# Patient Record
Sex: Male | Born: 1980 | Race: Asian | Hispanic: No | Marital: Married | Smoking: Former smoker
Health system: Southern US, Community
[De-identification: ages and names within clinical notes are randomized; demographics above are authoritative.]

## PROBLEM LIST (undated history)

## (undated) DIAGNOSIS — I10 Essential (primary) hypertension: Secondary | ICD-10-CM

## (undated) DIAGNOSIS — E785 Hyperlipidemia, unspecified: Secondary | ICD-10-CM

## (undated) HISTORY — PX: NO PAST SURGERIES: SHX2092

---

## 2012-04-20 ENCOUNTER — Other Ambulatory Visit: Payer: Self-pay | Admitting: Family Medicine

## 2012-04-20 ENCOUNTER — Ambulatory Visit
Admission: RE | Admit: 2012-04-20 | Discharge: 2012-04-20 | Disposition: A | Discharge status: Home / Self Care | Payer: Managed Care, Other (non HMO) | Source: Ambulatory Visit | Attending: Family Medicine | Admitting: Family Medicine

## 2012-04-20 DIAGNOSIS — J069 Acute upper respiratory infection, unspecified: Secondary | ICD-10-CM

## 2012-06-16 ENCOUNTER — Encounter (HOSPITAL_COMMUNITY): Payer: Self-pay | Admitting: Emergency Medicine

## 2012-06-16 ENCOUNTER — Emergency Department (INDEPENDENT_AMBULATORY_CARE_PROVIDER_SITE_OTHER): Payer: Managed Care, Other (non HMO)

## 2012-06-16 ENCOUNTER — Emergency Department (HOSPITAL_COMMUNITY)
Admission: EM | Admit: 2012-06-16 | Discharge: 2012-06-16 | Disposition: A | Discharge status: Home / Self Care | Payer: Managed Care, Other (non HMO) | Source: Home / Self Care | Attending: Family Medicine | Admitting: Family Medicine

## 2012-06-16 DIAGNOSIS — J309 Allergic rhinitis, unspecified: Secondary | ICD-10-CM

## 2012-06-16 DIAGNOSIS — M542 Cervicalgia: Secondary | ICD-10-CM

## 2012-06-16 DIAGNOSIS — J302 Other seasonal allergic rhinitis: Secondary | ICD-10-CM

## 2012-06-16 DIAGNOSIS — R209 Unspecified disturbances of skin sensation: Secondary | ICD-10-CM

## 2012-06-16 DIAGNOSIS — R202 Paresthesia of skin: Secondary | ICD-10-CM

## 2012-06-16 HISTORY — DX: Essential (primary) hypertension: I10

## 2012-06-16 HISTORY — DX: Hyperlipidemia, unspecified: E78.5

## 2012-06-16 LAB — POCT I-STAT, CHEM 8
Calcium, Ion: 1.22 mmol/L (ref 1.12–1.23)
Glucose, Bld: 88 mg/dL (ref 70–99)
HCT: 48 % (ref 39.0–52.0)
TCO2: 28 mmol/L (ref 0–100)

## 2012-06-16 MED ORDER — IBUPROFEN 600 MG PO TABS: 600.0000 mg | ORAL_TABLET | Freq: Three times a day (TID) | ORAL | Status: DC | PRN

## 2012-06-16 MED ORDER — CETIRIZINE HCL 10 MG PO TABS: 10.0000 mg | ORAL_TABLET | Freq: Every evening | ORAL | Status: DC | PRN

## 2012-06-16 NOTE — ED Notes (Signed)
Pt c/o numbness in left arm and hand x 1 week. Also has pain in chest right around rib cage. Feels SOB when walking for 10+ minutes. No changes in diet or strenuous activity recently. Took Acetaminophen for pain once time and symptoms subsided then returned. Patient is alert and oriented.

## 2012-06-19 NOTE — ED Provider Notes (Signed)
History     CSN: [id number]  Arrival date & time 06/16/12  1555   First MD Initiated Contact with Patient 06/16/12 1714      Chief Complaint  Patient presents with  . Numbness    (Consider location/radiation/quality/duration/timing/severity/associated sxs/prior treatment) HPI Comments: 32 y/o male smoker with prior h/o dyslipidemia and HTN. No taking any medications currently. here with the following complaints: 1) Neck pain for several month with radiation to left shoulder and left arm down to his left wrist. Associated with paresthesias (pulling and tingling sensation) intermittently. 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 Denies weakness or numbness. Patient is right handed. Tylenol helped.  2) Left lower rib border chest discomfort. Also intermittent. Denies chest pain here.  3) patient states she feels short of breath after walking for 10 minutes. State he was exercises and daily routine has remained the same. He tries to walk daily and has been feeling shortness of breath after 10 minutes of walking for over one week. Denies leg swelling or PND.  4) Wants to have cholesterol checked. 5) Nasal congestion, sneezing and bilateral ear pressure intermittently for 1-2 weeks. No cough, no wheezing.  States he has felt stressed as [place] from [place] recently.    Past Medical History  Diagnosis Date  . Hypertension   . Hyperlipemia     History reviewed. No pertinent past surgical history.  No family history on file.  History  Substance Use Topics  . Smoking status: Current Every Day Smoker -- 0.25 packs/day    Types: Cigarettes  . Smokeless tobacco: Not on file  . Alcohol Use: Yes     Comment: occasioinally      Review of Systems  Constitutional: Negative for fever, chills, diaphoresis, activity change, appetite change and unexpected weight change.  HENT: Positive for congestion and sneezing. Negative for sore throat and trouble  swallowing.   Respiratory: Positive for shortness of breath. Negative for cough, chest tightness and wheezing.   Cardiovascular: Negative for palpitations and leg swelling.  Gastrointestinal: Negative for nausea, vomiting, abdominal pain and diarrhea.  Endocrine: Negative for cold intolerance, heat intolerance, polydipsia, polyphagia and polyuria.  Musculoskeletal:       As per HPI  Neurological: Negative for dizziness, tremors, seizures, syncope, facial asymmetry, speech difficulty, weakness, light-headedness, numbness and headaches.  Psychiatric/Behavioral: Negative for behavioral problems and dysphoric mood.    Allergies  Review of patient's allergies indicates no known allergies.  Home Medications   Current Outpatient Rx  Name  Route  Sig  Dispense  Refill  . cetirizine (ZYRTEC) 10 MG tablet   Oral   Take 1 tablet (10 mg total) by mouth at bedtime as needed for allergies.   30 tablet   0   . ibuprofen (ADVIL,MOTRIN) 600 MG tablet   Oral   Take 1 tablet (600 mg total) by mouth every 8 (eight) hours as needed for pain.   21 tablet   0     BP 126/92  Pulse 86  Temp(Src) 99.3 F (37.4 C) (Oral)  Resp 16  SpO2 99%  Physical Exam  Nursing note and vitals reviewed. Constitutional: He is oriented to person, place, and time. He appears well-developed and well-nourished. No distress.  HENT:  Head: Normocephalic and atraumatic.  Right Ear: External ear normal.  Left Ear: External ear normal.  Nasal Congestion with erythema and swelling of nasal turbinates, clear rhinorrhea. no pharyngeal erythema no exudates. No uvula deviation. No trismus.  TM's normal.  Eyes: Conjunctivae and EOM are normal. Pupils are equal, round, and reactive to light. No scleral icterus.  Neck: Neck supple. No JVD present. No thyromegaly present.  Cervical spine central: No obvious deformity. No pain over bone processes.  Fair range of motion despite reported pain. Able to touch chest with chin and  extend with minimal discomfort. Pain worse with bilateral rotation. Increased tone and tenderness to palpation over cervical paraspinal muscles and trapezium muscles bilaterally. Negative Spurling test.   Cardiovascular: Normal rate, regular rhythm and normal heart sounds.  Exam reveals no gallop and no friction rub.   No murmur heard. Pulmonary/Chest: Breath sounds normal. No respiratory distress. He has no wheezes. He has no rales. He exhibits no tenderness.  Lymphadenopathy:    He has no cervical adenopathy.  Neurological: He is alert and oriented to person, place, and time. He has normal strength and normal reflexes. No cranial nerve deficit or sensory deficit. He displays a negative Romberg sign. Coordination and gait normal.  Normal bilateral upper extremity strength. No arm droop.  Symmetric DTR's. Appears intact superficial sensation and proprioception.   Skin: He is not diaphoretic.    ED Course  Procedures (including critical care time)  Labs Reviewed  POCT I-STAT, CHEM 8   No results found. EKG:  NSR, VR 72 bpm, no ischemic changes.   1. Paresthesia of left arm   2. Neck pain   3. Seasonal allergies       MDM  Reassuring physical exam,  normal EKG and poc I-stat: (electrolytes, blood sugar and creatinine).  No signs of stenosis or malalignment on plain neck Xrays.  Treated with cetirizine and ibuprofen.  reccommended to stablish care with a PCP and provided contact info for PCP offices in our area. Cardiology referral as needed. Will likely require a stress test.   Supportive care and red flags that should prompt hi return to medical attention discussed with patient and provided in wriitng.            Sharin Grave, MD 06/23/12 1004

## 2015-11-16 ENCOUNTER — Ambulatory Visit (INDEPENDENT_AMBULATORY_CARE_PROVIDER_SITE_OTHER): Payer: Managed Care, Other (non HMO) | Admitting: Family Medicine

## 2015-11-16 ENCOUNTER — Encounter: Payer: Self-pay | Admitting: Family Medicine

## 2015-11-16 VITALS — BP 118/74 | HR 62 | Temp 98.1°F | Resp 12 | Ht 67.0 in | Wt 200.1 lb

## 2015-11-16 DIAGNOSIS — E6609 Other obesity due to excess calories: Secondary | ICD-10-CM

## 2015-11-16 DIAGNOSIS — M79605 Pain in left leg: Secondary | ICD-10-CM | POA: Diagnosis not present

## 2015-11-16 DIAGNOSIS — Z114 Encounter for screening for human immunodeficiency virus [HIV]: Secondary | ICD-10-CM

## 2015-11-16 DIAGNOSIS — Z6831 Body mass index (BMI) 31.0-31.9, adult: Secondary | ICD-10-CM | POA: Diagnosis not present

## 2015-11-16 DIAGNOSIS — M79652 Pain in left thigh: Secondary | ICD-10-CM | POA: Diagnosis not present

## 2015-11-16 LAB — COMPREHENSIVE METABOLIC PANEL
ALBUMIN: 4.3 g/dL (ref 3.5–5.2)
ALK PHOS: 67 U/L (ref 39–117)
ALT: 29 U/L (ref 0–53)
AST: 20 U/L (ref 0–37)
BILIRUBIN TOTAL: 0.5 mg/dL (ref 0.2–1.2)
BUN: 10 mg/dL (ref 6–23)
CALCIUM: 9.5 mg/dL (ref 8.4–10.5)
CO2: 29 meq/L (ref 19–32)
CREATININE: 0.79 mg/dL (ref 0.40–1.50)
Chloride: 105 mEq/L (ref 96–112)
GFR: 117.17 mL/min (ref >60.00)
Glucose, Bld: 80 mg/dL (ref 70–99)
Potassium: 4.3 mEq/L (ref 3.5–5.1)
Sodium: 138 mEq/L (ref 135–145)
TOTAL PROTEIN: 7 g/dL (ref 6.0–8.3)

## 2015-11-16 LAB — HIV ANTIBODY (ROUTINE TESTING W REFLEX): HIV 1&2 Ab, 4th Generation: NONREACTIVE

## 2015-11-16 LAB — LIPID PANEL
CHOL/HDL RATIO: 5
CHOLESTEROL: 185 mg/dL (ref 0–200)
HDL: 38.5 mg/dL — ABNORMAL LOW (ref >39.00)
LDL Cholesterol: 111 mg/dL — ABNORMAL HIGH (ref 0–99)
NonHDL: 146.24
TRIGLYCERIDES: 175 mg/dL — AB (ref 0.0–149.0)
VLDL: 35 mg/dL (ref 0.0–40.0)

## 2015-11-16 MED ORDER — IBUPROFEN 600 MG PO TABS: 600.0000 mg | ORAL_TABLET | Freq: Three times a day (TID) | ORAL | 0 refills | Status: DC | PRN

## 2015-11-16 NOTE — Progress Notes (Signed)
Musculoskeletal Exam  Patient: Kevin Callahan DOB: May 30, 1980  DOS: 11/16/2015  SUBJECTIVE:  Chief Complaint:   Chief Complaint  Patient presents with  . Establish Care    requesting cpe    Kevin Callahan is a 35 y.o.  male for evaluation and treatment of L thigh pain.   Onset:  1 yr ago. Was playing badminton, sudden.  Location: Left anterior thigh and left calf Character:  dull  Progression of issue:  is unchanged Associated symptoms: None Treatment: to date has been: None.   Neurovascular symptoms: no  ROS: Musculoskeletal/Extremities: +L calf and thigh pain Neurologic: no numbness, tingling no weakness   Past Medical History:  Diagnosis Date  . Hyperlipemia   . Hypertension    Past Surgical History:  Procedure Laterality Date  . NO PAST SURGERIES     History reviewed. No pertinent family history. Current Outpatient Prescriptions  Medication Sig Dispense Refill  . cetirizine (ZYRTEC) 10 MG tablet Take 1 tablet (10 mg total) by mouth at bedtime as needed for allergies. (Patient not taking: Reported on 11/16/2015) 30 tablet 0  . ibuprofen (ADVIL,MOTRIN) 600 MG tablet Take 1 tablet (600 mg total) by mouth every 8 (eight) hours as needed for pain. (Patient not taking: Reported on 11/16/2015) 21 tablet 0   No Known Allergies Social History   Social History  . Marital status: Married   Social History Main Topics  . Smoking status: Former Smoker    Packs/day: 0.25    Types: Cigarettes  . Smokeless tobacco: Never Used  . Alcohol use Yes     Comment: occasioinally  . Drug use: No   Objective: VITAL SIGNS: BP 118/74 (BP Location: Left Arm, Patient Position: Sitting, Cuff Size: Normal)   Pulse 62   Temp 98.1 F (36.7 C) (Oral)   Resp 12   Ht 5\' 7"  (1.702 m)   Wt 200 lb 1 oz (90.7 kg)   SpO2 96%   BMI 31.33 kg/m  Constitutional: Well formed, well developed. No acute distress. Cardiovascular: RRR, no LE edema, no bruits Thorax & Lungs: No accessory muscle  use,CTAB Extremities: No clubbing. No cyanosis. No edema.  Skin: Warm. Dry. No erythema. No rash.  Musculoskeletal: L thigh.   Normal active range of motion: yes.   Normal passive range of motion: yes Tenderness to palpation: yes- diffusely along the quadriceps on the left, also tender to palpation over the lateral left calf Deformity: no Ecchymosis: no 5/5 strength throughout the lower extremities, pain was elicited with resisted knee extension on the left Neurologic: Normal sensory function. No focal deficits noted. DTR's equal and symmetry in LE's. No clonus. Psychiatric: Normal mood. Age appropriate judgment and insight. Alert & oriented x 3.    Assessment:  Left leg pain - Plan: ibuprofen (ADVIL,MOTRIN) 600 MG tablet  Pain of left thigh - Plan: ibuprofen (ADVIL,MOTRIN) 600 MG tablet  Class 1 obesity due to excess calories with body mass index (BMI) of 31.0 to 31.9 in adult, unspecified whether serious comorbidity present - Plan: Comprehensive metabolic panel, Lipid panel  Screening for HIV (human immunodeficiency virus) - Plan: HIV antibody  Plan: Orders as above. I personally showed him several stretches that he said did feet make his pain feel better. Recommended heat, could add Tylenol, and twice daily stretching. He also wanted to bring up issues with his scalp and low back pain. He will follow-up in 2 weeks for this. I would like to see him in 4 weeks if his leg pain is  not better and we'll consider physical therapy. He did not want to start out with physical therapy today. The patient voiced understanding and agreement to the plan.   Jilda Roche [place], DO 11/16/15  10:50 AM

## 2015-11-16 NOTE — Progress Notes (Signed)
Pre visit review using our clinic review tool, if applicable. No additional management support is needed unless otherwise documented below in the visit note. 

## 2015-11-16 NOTE — Patient Instructions (Addendum)
Do your stretches 2 times daily. Hold each stretch for 20-30 seconds. Repeat 2-3 times. Use heat for around 5-10 minutes before stretching. Use the pain medicine as needed. You may also you Tylenol for this.  Get rid of any scented products that go on the skin. This may help with your itching.

## 2015-11-30 ENCOUNTER — Ambulatory Visit: Payer: Managed Care, Other (non HMO) | Admitting: Family Medicine

## 2015-12-10 ENCOUNTER — Encounter: Payer: Self-pay | Admitting: Family Medicine

## 2015-12-10 ENCOUNTER — Ambulatory Visit (INDEPENDENT_AMBULATORY_CARE_PROVIDER_SITE_OTHER): Payer: Managed Care, Other (non HMO) | Admitting: Family Medicine

## 2015-12-10 VITALS — BP 92/68 | HR 112 | Temp 98.1°F | Ht 67.0 in | Wt 198.0 lb

## 2015-12-10 DIAGNOSIS — J029 Acute pharyngitis, unspecified: Secondary | ICD-10-CM | POA: Diagnosis not present

## 2015-12-10 DIAGNOSIS — R059 Cough, unspecified: Secondary | ICD-10-CM

## 2015-12-10 DIAGNOSIS — R05 Cough: Secondary | ICD-10-CM | POA: Diagnosis not present

## 2015-12-10 MED ORDER — BENZONATATE 100 MG PO CAPS: 100.0000 mg | ORAL_CAPSULE | Freq: Three times a day (TID) | ORAL | 0 refills | Status: DC | PRN

## 2015-12-10 NOTE — Patient Instructions (Signed)
For your foot, try over the counter orthotics. Powerstep is available at [employer] or other retailers.

## 2015-12-10 NOTE — Progress Notes (Signed)
Chief Complaint  Patient presents with  . Cough    Pt reports fever and cough x5 days with sore throat     Kevin ReamerGanesh Callahan here for URI complaints.  Duration: 5 days  Associated symptoms: subjective fever, productive cough, sinus congestion and sore throat Denies: rhinorrhea, itchy watery eyes, ear pain, ear drainage and shortness of breath Treatment to date: Nyquil Sick contacts: Yes- Daughter has strep throat  ROS:  Const: +fevers HEENT: As noted in HPI Lungs: No SOB  Past Medical History:  Diagnosis Date  . Hyperlipemia   . Hypertension    No family history on file.  BP 92/68 (BP Location: Left Arm, Patient Position: Sitting, Cuff Size: Small)   Pulse (!) 112   Temp 98.1 F (36.7 C) (Oral)   Ht 5\' 7"  (1.702 m)   Wt 198 lb (89.8 kg)   SpO2 98%   BMI 31.01 kg/m  General: Awake, alert, appears stated age HEENT: AT, [place], ears patent b/l and TM's neg, nares patent w/o discharge, pharynx pink and without exudates, MMM Neck: No masses or asymmetry Heart: RRR, no murmurs, no bruits Lungs: CTAB, no accessory muscle use Psych: Age appropriate judgment and insight, normal mood and affect  Cough - Plan: benzonatate (TESSALON) 100 MG capsule  Sore throat  Orders as above. Rapid strep neg. Pt brought up foot pain at the end of the visit. I noticed he has flat feet so I recommended OTC Powerstep orthotics. F/u for this during a separate appt. F/u in 1 week if symptoms worsen or fail to improve. Pt voiced understanding and agreement to the plan.  Kevin Rocheicholas Paul [place], DO 12/10/15 3:51 PM

## 2015-12-10 NOTE — Progress Notes (Signed)
Pre visit review using our clinic review tool, if applicable. No additional management support is needed unless otherwise documented below in the visit note. 

## 2018-04-29 ENCOUNTER — Telehealth: Payer: Self-pay

## 2018-04-29 NOTE — Telephone Encounter (Signed)
Called the patient and has moved away. Updated PCP in chart.

## 2018-04-29 NOTE — Telephone Encounter (Signed)
Last ov 2017- virtual visit?  

## 2019-03-05 ENCOUNTER — Ambulatory Visit: Payer: 59 | Attending: Internal Medicine

## 2019-03-05 DIAGNOSIS — Z23 Encounter for immunization: Secondary | ICD-10-CM

## 2019-03-05 NOTE — Progress Notes (Signed)
   Covid-19 Vaccination Clinic  Name:  Kevin Callahan    MRN: [id number] DOB: 1980/05/28  03/05/2019  Mr. Bare was observed post Covid-19 immunization for 15 minutes without incidence. He was provided with Vaccine Information Sheet and instruction to access the V-Safe system.   [name] was instructed to call 911 with any severe reactions post vaccine: Marland Kitchen Difficulty breathing  . Swelling of your face and throat  . A fast heartbeat  . A bad rash all over your body  . Dizziness and weakness    Immunizations Administered    Name Date Dose VIS Date Route   Pfizer COVID-19 Vaccine 03/05/2019  2:27 PM 0.3 mL 12/31/2018 Intramuscular   Manufacturer: [employer], [employer]   Lot: [id number]   NDC: [address]-4

## 2019-03-28 ENCOUNTER — Ambulatory Visit: Payer: 59 | Attending: Internal Medicine

## 2019-03-28 DIAGNOSIS — Z23 Encounter for immunization: Secondary | ICD-10-CM | POA: Insufficient documentation

## 2019-03-28 NOTE — Progress Notes (Signed)
   Covid-19 Vaccination Clinic  Name:  Kevin Callahan    MRN: [id number] DOB: Jun 06, 1980  03/28/2019  [name] was observed post Covid-19 immunization for 15 minutes without incident. He was provided with Vaccine Information Sheet and instruction to access the V-Safe system.   [name] was instructed to call 911 with any severe reactions post vaccine: Marland Kitchen Difficulty breathing  . Swelling of face and throat  . A fast heartbeat  . A bad rash all over body  . Dizziness and weakness   Immunizations Administered    Name Date Dose VIS Date Route   Pfizer COVID-19 Vaccine 03/28/2019  2:45 PM 0.3 mL 12/31/2018 Intramuscular   Manufacturer: [employer], [employer]   Lot: [id number]   NDC: [address]-7

## 2021-06-08 ENCOUNTER — Observation Stay (HOSPITAL_COMMUNITY)
Admission: EM | Admit: 2021-06-08 | Discharge: 2021-06-10 | Disposition: A | Discharge status: Home / Self Care | Payer: 59 | Attending: Family Medicine | Admitting: Family Medicine

## 2021-06-08 ENCOUNTER — Emergency Department (HOSPITAL_COMMUNITY): Payer: 59

## 2021-06-08 ENCOUNTER — Encounter (HOSPITAL_COMMUNITY): Payer: Self-pay | Admitting: Emergency Medicine

## 2021-06-08 ENCOUNTER — Inpatient Hospital Stay (HOSPITAL_COMMUNITY): Payer: 59

## 2021-06-08 DIAGNOSIS — R1084 Generalized abdominal pain: Principal | ICD-10-CM | POA: Insufficient documentation

## 2021-06-08 DIAGNOSIS — R1011 Right upper quadrant pain: Secondary | ICD-10-CM | POA: Diagnosis not present

## 2021-06-08 DIAGNOSIS — E876 Hypokalemia: Secondary | ICD-10-CM | POA: Diagnosis not present

## 2021-06-08 DIAGNOSIS — E872 Acidosis, unspecified: Secondary | ICD-10-CM | POA: Diagnosis not present

## 2021-06-08 DIAGNOSIS — Z87891 Personal history of nicotine dependence: Secondary | ICD-10-CM | POA: Diagnosis not present

## 2021-06-08 DIAGNOSIS — R1010 Upper abdominal pain, unspecified: Principal | ICD-10-CM

## 2021-06-08 DIAGNOSIS — R42 Dizziness and giddiness: Secondary | ICD-10-CM | POA: Diagnosis not present

## 2021-06-08 DIAGNOSIS — I10 Essential (primary) hypertension: Secondary | ICD-10-CM | POA: Insufficient documentation

## 2021-06-08 DIAGNOSIS — R109 Unspecified abdominal pain: Secondary | ICD-10-CM | POA: Diagnosis present

## 2021-06-08 LAB — BLOOD GAS, VENOUS
Acid-base deficit: 3.8 mmol/L — ABNORMAL HIGH (ref 0.0–2.0)
Bicarbonate: 21.5 mmol/L (ref 20.0–28.0)
Drawn by: 58743
O2 Saturation: 87.4 %
Patient temperature: 36.7
pCO2, Ven: 38 mmHg — ABNORMAL LOW (ref 44–60)
pH, Ven: 7.35 (ref 7.25–7.43)
pO2, Ven: 52 mmHg — ABNORMAL HIGH (ref 32–45)

## 2021-06-08 LAB — CBC WITH DIFFERENTIAL/PLATELET
Abs Immature Granulocytes: 0.14 10*3/uL — ABNORMAL HIGH (ref 0.00–0.07)
Basophils Absolute: 0 10*3/uL (ref 0.0–0.1)
Basophils Relative: 0 %
Eosinophils Absolute: 0.1 10*3/uL (ref 0.0–0.5)
Eosinophils Relative: 1 %
HCT: 44.2 % (ref 39.0–52.0)
Hemoglobin: 15.5 g/dL (ref 13.0–17.0)
Immature Granulocytes: 2 %
Lymphocytes Relative: 31 %
Lymphs Abs: 3 10*3/uL (ref 0.7–4.0)
MCH: 29.1 pg (ref 26.0–34.0)
MCHC: 35.1 g/dL (ref 30.0–36.0)
MCV: 82.9 fL (ref 80.0–100.0)
Monocytes Absolute: 0.6 10*3/uL (ref 0.1–1.0)
Monocytes Relative: 6 %
Neutro Abs: 5.8 10*3/uL (ref 1.7–7.7)
Neutrophils Relative %: 60 %
Platelets: 360 10*3/uL (ref 150–400)
RBC: 5.33 MIL/uL (ref 4.22–5.81)
RDW: 12.6 % (ref 11.5–15.5)
WBC: 9.6 10*3/uL (ref 4.0–10.5)
nRBC: 0 % (ref 0.0–0.2)

## 2021-06-08 LAB — URINALYSIS, ROUTINE W REFLEX MICROSCOPIC
Bilirubin Urine: NEGATIVE
Glucose, UA: 50 mg/dL — AB
Hgb urine dipstick: NEGATIVE
Ketones, ur: 5 mg/dL — AB
Leukocytes,Ua: NEGATIVE
Nitrite: NEGATIVE
Protein, ur: NEGATIVE mg/dL
Specific Gravity, Urine: 1.012 (ref 1.005–1.030)
pH: 5 (ref 5.0–8.0)

## 2021-06-08 LAB — HEMOGLOBIN A1C
Hgb A1c MFr Bld: 4.9 % (ref 4.8–5.6)
Mean Plasma Glucose: 93.93 mg/dL

## 2021-06-08 LAB — RAPID URINE DRUG SCREEN, HOSP PERFORMED
Amphetamines: NOT DETECTED
Barbiturates: NOT DETECTED
Benzodiazepines: NOT DETECTED
Cocaine: NOT DETECTED
Opiates: POSITIVE — AB
Tetrahydrocannabinol: NOT DETECTED

## 2021-06-08 LAB — COMPREHENSIVE METABOLIC PANEL
ALT: 20 U/L (ref 0–44)
AST: 25 U/L (ref 15–41)
Albumin: 4.2 g/dL (ref 3.5–5.0)
Alkaline Phosphatase: 66 U/L (ref 38–126)
Anion gap: 16 — ABNORMAL HIGH (ref 5–15)
BUN: 6 mg/dL (ref 6–20)
CO2: 17 mmol/L — ABNORMAL LOW (ref 22–32)
Calcium: 9.3 mg/dL (ref 8.9–10.3)
Chloride: 106 mmol/L (ref 98–111)
Creatinine, Ser: 1.09 mg/dL (ref 0.61–1.24)
GFR, Estimated: >60 mL/min (ref >60)
Glucose, Bld: 205 mg/dL — ABNORMAL HIGH (ref 70–99)
Potassium: 3.2 mmol/L — ABNORMAL LOW (ref 3.5–5.1)
Sodium: 139 mmol/L (ref 135–145)
Total Bilirubin: 0.4 mg/dL (ref 0.3–1.2)
Total Protein: 6.9 g/dL (ref 6.5–8.1)

## 2021-06-08 LAB — SALICYLATE LEVEL: Salicylate Lvl: <7 mg/dL — ABNORMAL LOW (ref 7.0–30.0)

## 2021-06-08 LAB — HIV ANTIBODY (ROUTINE TESTING W REFLEX): HIV Screen 4th Generation wRfx: NONREACTIVE

## 2021-06-08 LAB — TROPONIN I (HIGH SENSITIVITY)
Troponin I (High Sensitivity): 4 ng/L (ref <18)
Troponin I (High Sensitivity): 6 ng/L (ref <18)

## 2021-06-08 LAB — LIPASE, BLOOD: Lipase: 30 U/L (ref 11–51)

## 2021-06-08 LAB — ETHANOL: Alcohol, Ethyl (B): <10 mg/dL (ref <10)

## 2021-06-08 LAB — LACTIC ACID, PLASMA
Lactic Acid, Venous: 6.7 mmol/L (ref 0.5–1.9)
Lactic Acid, Venous: 5.9 mmol/L (ref 0.5–1.9)
Lactic Acid, Venous: 7.2 mmol/L (ref 0.5–1.9)
Lactic Acid, Venous: 4.8 mmol/L (ref 0.5–1.9)

## 2021-06-08 MED ORDER — OXYCODONE HCL 5 MG PO TABS
2.5000 mg | ORAL_TABLET | Freq: Four times a day (QID) | ORAL | Status: DC | PRN
  Administered 2021-06-08 – 2021-06-09 (×3): 2.5 mg via ORAL
  Filled 2021-06-08 (×3): qty 1

## 2021-06-08 MED ORDER — LACTATED RINGERS IV BOLUS
1500.0000 mL | Freq: Once | INTRAVENOUS | Status: AC
  Administered 2021-06-08: 1500 mL via INTRAVENOUS

## 2021-06-08 MED ORDER — ACETAMINOPHEN 650 MG RE SUPP: 650.0000 mg | Freq: Four times a day (QID) | RECTAL | Status: DC | PRN

## 2021-06-08 MED ORDER — POLYETHYLENE GLYCOL 3350 17 G PO PACK
17.0000 g | PACK | Freq: Once | ORAL | Status: AC
  Administered 2021-06-08: 17 g via ORAL
  Filled 2021-06-08: qty 1

## 2021-06-08 MED ORDER — ONDANSETRON HCL 4 MG PO TABS: 4.0000 mg | ORAL_TABLET | Freq: Four times a day (QID) | ORAL | Status: DC | PRN

## 2021-06-08 MED ORDER — IOHEXOL 350 MG/ML SOLN
100.0000 mL | Freq: Once | INTRAVENOUS | Status: AC | PRN
  Administered 2021-06-08: 100 mL via INTRAVENOUS

## 2021-06-08 MED ORDER — POLYETHYLENE GLYCOL 3350 17 G PO PACK
17.0000 g | PACK | Freq: Every day | ORAL | Status: DC
  Administered 2021-06-08: 17 g via ORAL
  Filled 2021-06-08: qty 1

## 2021-06-08 MED ORDER — ACETAMINOPHEN 325 MG PO TABS
650.0000 mg | ORAL_TABLET | Freq: Four times a day (QID) | ORAL | Status: DC | PRN
  Filled 2021-06-08: qty 2

## 2021-06-08 MED ORDER — SODIUM CHLORIDE 0.9 % IV SOLN: INTRAVENOUS | Status: DC

## 2021-06-08 MED ORDER — ONDANSETRON HCL 4 MG/2ML IJ SOLN
4.0000 mg | Freq: Four times a day (QID) | INTRAMUSCULAR | Status: DC | PRN
  Administered 2021-06-08: 4 mg via INTRAVENOUS
  Filled 2021-06-08: qty 2

## 2021-06-08 MED ORDER — PANTOPRAZOLE SODIUM 40 MG PO TBEC
40.0000 mg | DELAYED_RELEASE_TABLET | Freq: Every day | ORAL | Status: DC
  Administered 2021-06-08 – 2021-06-09 (×2): 40 mg via ORAL
  Filled 2021-06-08 (×2): qty 1

## 2021-06-08 MED ORDER — MECLIZINE HCL 12.5 MG PO TABS
12.5000 mg | ORAL_TABLET | Freq: Two times a day (BID) | ORAL | Status: DC
  Administered 2021-06-08: 12.5 mg via ORAL
  Filled 2021-06-08 (×2): qty 1

## 2021-06-08 MED ORDER — LACTATED RINGERS IV BOLUS
1000.0000 mL | Freq: Once | INTRAVENOUS | Status: AC
  Administered 2021-06-08: 1000 mL via INTRAVENOUS

## 2021-06-08 MED ORDER — SODIUM CHLORIDE 0.9 % IV BOLUS
1000.0000 mL | Freq: Once | INTRAVENOUS | Status: AC
  Administered 2021-06-08: 1000 mL via INTRAVENOUS

## 2021-06-08 MED ORDER — CALCIUM CARBONATE ANTACID 500 MG PO CHEW
1.0000 | CHEWABLE_TABLET | Freq: Once | ORAL | Status: AC
  Administered 2021-06-08: 200 mg via ORAL
  Filled 2021-06-08: qty 1

## 2021-06-08 MED ORDER — SODIUM CHLORIDE 0.9 % IV SOLN
25.0000 mg | Freq: Once | INTRAVENOUS | Status: AC
  Administered 2021-06-08: 25 mg via INTRAVENOUS
  Filled 2021-06-08: qty 1

## 2021-06-08 MED ORDER — SENNOSIDES-DOCUSATE SODIUM 8.6-50 MG PO TABS
1.0000 | ORAL_TABLET | Freq: Every day | ORAL | Status: DC
  Administered 2021-06-08 – 2021-06-10 (×3): 1 via ORAL
  Filled 2021-06-08 (×3): qty 1

## 2021-06-08 MED ORDER — MORPHINE SULFATE (PF) 4 MG/ML IV SOLN
4.0000 mg | Freq: Once | INTRAVENOUS | Status: AC
  Administered 2021-06-08: 4 mg via INTRAVENOUS
  Filled 2021-06-08: qty 1

## 2021-06-08 MED ORDER — POTASSIUM CHLORIDE 20 MEQ PO PACK: 40.0000 meq | PACK | Freq: Two times a day (BID) | ORAL | Status: DC

## 2021-06-08 MED ORDER — HYDROMORPHONE HCL 1 MG/ML IJ SOLN
1.0000 mg | Freq: Once | INTRAMUSCULAR | Status: AC
  Administered 2021-06-08: 1 mg via INTRAVENOUS
  Filled 2021-06-08: qty 1

## 2021-06-08 MED ORDER — POTASSIUM CHLORIDE 20 MEQ PO PACK
40.0000 meq | PACK | Freq: Once | ORAL | Status: AC
  Administered 2021-06-08: 40 meq via ORAL
  Filled 2021-06-08: qty 2

## 2021-06-08 MED ORDER — ACETAMINOPHEN 500 MG PO TABS
1000.0000 mg | ORAL_TABLET | Freq: Four times a day (QID) | ORAL | Status: DC
  Administered 2021-06-08 – 2021-06-10 (×7): 1000 mg via ORAL
  Filled 2021-06-08 (×8): qty 2

## 2021-06-08 NOTE — ED Notes (Signed)
Patient transported to [facility] 

## 2021-06-08 NOTE — H&P (Addendum)
Family Medicine Teaching [facility] Admission History and Physical Service Pager: [phone]  Patient name: Kevin Callahan Medical record number: [id number] Date of birth: 16-Sep-1980 Age: 41 y.o. Gender: male  Primary Care Provider: [facility], [facility] Consultants: Gen Surg Code Status: Full code Preferred Emergency Contact:  Contact Information     Name Relation Home Work Cleona Spouse [phone]  [phone]        Chief Complaint: Abdominal Pain  Assessment and Plan: Kevin Callahan is a 42 y.o. male presenting with abdominal pain. PMH is significant for HTN and HLD.   Abdominal Pain with lactic acidosis  Patient reports mid epigastric pain that started from the upper back before radiating to his abdomen. On exam patient abdomen is soft, distended and non-tender. Somnolent during encounter likely due to dilaudid but was easily aroused .  Initial labs show normal lipase and normal LFTs. Lactic acid was elevated to 6.9 which was suspicious for ischemic bowel etiology but CTA abdomen was negative for thoracic or abdominal aortic dissection, retroperitoneal hematoma or intestinal obstruction. His elevated lactic acid is suspicious for ischemic colitis although no evidence at this time. Unclear cause of abdominal pain other differentials include biliary colic, gastritis etc. RUQ [place] is pending.  General surgery consulted in the ED due to initial concern for aortic dissection who recommend pain management and admission for observation. - Admit to med surg with FPTS, attending Dr. Manson Passey -General surg consulted in the ER, appreciate recs -Regular diet  -Continue routine vitals -Avoid NSAIDs -Pain regime Tylenol 1000 mg Q6H Oxycodone 2.5 mg every 6 hours as needed -Zofran Q6H PRN -Pantoprazole 40 mg daily -Follow-up RUQ [place] - Start mIVF NS at 191ml/hr -Trend LA -Consider GI consult am if no improvement in sx overnight  Hypokalemia K  on admission was 3.2. Patient denies any chest pains or palpitation. Will replete with K. Could be 2/2 vomiting episodes this morning -Morning BMP lab -Replete as necessary  Vertigo Patient reported having abrupt onset of vertigo this morning accompanied with nausea and vomiting x1. He still reports intermittent felling of the room spinning. No trinities, change in vision or difficulty hearing. Will start him on Meclizine -Start Meclizine 12.5mg  BID   HLD Last lipid panel was about 5 years ago. Will follow up lipid panael with AM labs.  Not on any home meds. -Lipid Panel with morning Labs   HTN BP range on admission 129-165/85-98. He is asymptomatic and not on any home medication. Could benefit from BP medication. Needs follow up with PCP outpatient -Will monitor BP with routine vitals.       FEN/GI: regular diet, normal saline  Prophylaxis: early ambulation   Disposition: Med Surg  History of Present Illness:  Kevin Callahan is a 41 y.o. male presenting with mid epigastric abdominal pain  Patient reported abdominal pain that started sometime around 9 AM this morning.  Describes it as sudden onset of squeezing pain started on his upper back and radiated to the epigastric area.  Chest pain was 10 out of 10 and pain was not elicited by feeding.  He tried ginger beer with no relief and nothing seems to aggravate or relieve the pain.  Reports he has always had normal bowel movements.  Last BM was yesterday.  Constipation, no diarrhea's, no blood in stool.  Has no recent change in diet or recent travel.  Denies having any long history of aspirin or ibuprofen use.  No history of ulcers and not  on any medication.  Patient did report having nausea with NBNB emesis earlier this morning prior to the onset of the abdominal pain. He reported the room was spinning earlier this morning before his nausea and vomiting which has since improved.   Of note patient stated that in the past 5 years he  occassionally get bloated with heavy feeding and that usually improved with over the counter antacids. He's never been diagnosed with GERD.  In the ED, patient received GI cocktail which patient reports relieved his pain. He also received IV bolus of NS.    Review Of Systems: Per HPI with the following additions:   Review of Systems  HENT:  Negative for hearing loss and tinnitus.   Respiratory:  Negative for cough, chest tightness and shortness of breath.   Cardiovascular:  Negative for chest pain, palpitations and leg swelling.  Gastrointestinal:  Positive for abdominal pain and nausea. Negative for blood in stool, constipation and diarrhea.  Musculoskeletal:  Positive for back pain. Negative for gait problem and joint swelling.  Neurological:  Positive for dizziness. Negative for speech difficulty, weakness and headaches.    Patient Active Problem List   Diagnosis Date Noted   Abdominal pain 06/08/2021    Past Medical History: Past Medical History:  Diagnosis Date   Hyperlipemia    Hypertension     Past Surgical History: Past Surgical History:  Procedure Laterality Date   NO PAST SURGERIES      Social History: Social History   Tobacco Use   Smoking status: Former    Packs/day: 0.25    Types: Cigarettes   Smokeless tobacco: Never  Substance Use Topics   Alcohol use: Yes    Comment: occasioinally   Drug use: No   Additional social history:   Please also refer to relevant sections of EMR.  Family History: No family history on file.   Allergies and Medications: No Known Allergies No current facility-administered medications on file prior to encounter.   Current Outpatient Medications on File Prior to Encounter  Medication Sig Dispense Refill   benzonatate (TESSALON) 100 MG capsule Take 1 capsule (100 mg total) by mouth 3 (three) times daily as needed for cough. 20 capsule 0   cetirizine (ZYRTEC) 10 MG tablet Take 1 tablet (10 mg total) by mouth at bedtime as  needed for allergies. 30 tablet 0   ibuprofen (ADVIL,MOTRIN) 600 MG tablet Take 1 tablet (600 mg total) by mouth every 8 (eight) hours as needed. 30 tablet 0    Objective: BP (!) 161/98   Pulse 91   Temp 98 F (36.7 C) (Oral)   Resp 18   Ht 5\' 7"  (1.702 m)   Wt 88.5 kg   SpO2 97%   BMI 30.54 kg/m  Exam: General: Alert, well appearing, somnolent HEENT: Atraumatic, MMM, No sclera icterus CV: RRR, no murmurs, normal S1/S2 Pulm: CTAB, good WOB on RA, no crackles or wheezing Abd: Soft, distended, no tenderness Skin: dry, warm Ext: No BLE edema,   Labs and Imaging: CBC BMET  Recent Labs  Lab 06/08/21 0904  WBC 9.6  HGB 15.5  HCT 44.2  PLT 360   Recent Labs  Lab 06/08/21 0904  NA 139  K 3.2*  CL 106  CO2 17*  BUN 6  CREATININE 1.09  GLUCOSE 205*  CALCIUM 9.3     DG Abdomen Acute W/Chest  Result Date: 06/08/2021 CLINICAL DATA:  Abdominal pain, nausea, vomiting EXAM: DG ABDOMEN ACUTE WITH 1 VIEW CHEST COMPARISON:  None Available. FINDINGS: Bowel gas pattern is nonspecific. Moderate amount of stool is seen in the colon. There is no fecal impaction in the rectosigmoid. No abnormal masses or calcifications are seen. Transverse diameter of heart is slightly increased. There are no signs of pulmonary edema or focal pulmonary consolidation. There is no pleural effusion or pneumothorax. IMPRESSION: Nonspecific bowel gas pattern. There are no focal pulmonary infiltrates. Electronically Signed   By: Ernie AvenaPalani  Rathinasamy M.D.   On: 06/08/2021 10:00   CT Angio Chest/Abd/Pel for Dissection W and/or Wo Contrast  Result Date: 06/08/2021 CLINICAL DATA:  Acute aortic syndrome suspected EXAM: CT ANGIOGRAPHY CHEST, ABDOMEN AND PELVIS TECHNIQUE: Non-contrast CT of the chest was initially obtained. Multidetector CT imaging through the chest, abdomen and pelvis was performed using the standard protocol during bolus administration of intravenous contrast. Multiplanar reconstructed images and  MIPs were obtained and reviewed to evaluate the vascular anatomy. RADIATION DOSE REDUCTION: This exam was performed according to the departmental dose-optimization program which includes automated exposure control, adjustment of the mA and/or kV according to patient size and/or use of iterative reconstruction technique. CONTRAST:  100mL OMNIPAQUE IOHEXOL 350 MG/ML SOLN COMPARISON:  None Available. FINDINGS: CTA CHEST FINDINGS Cardiovascular: There is no mural hematoma in thoracic aorta in the noncontrast images. There is no demonstrable intimal flap in the thoracic aorta. Major branches of thoracic aorta appear patent. 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 There are no intraluminal filling defects in the central pulmonary artery branches. Evaluation of small peripheral branches is limited in this examination tailored for evaluation of the systemic circulation. Mediastinum/Nodes: No significant lymphadenopathy seen. There is 8 mm low-density nodule in the left lobe of thyroid. Lungs/Pleura: There is small air-filled structure posterior to the trachea in the lower neck, possibly a diverticulum arising from the cervical esophagus. There is no evidence of any adjacent inflammation. There are no focal pulmonary infiltrates. There is no pleural effusion or pneumothorax. Musculoskeletal: Unremarkable. Review of the MIP images confirms the above findings. CTA ABDOMEN AND PELVIS FINDINGS VASCULAR Aorta: There is no evidence of dissection in the abdominal aorta. There is no focal aneurysmal dilation. Celiac: Normal. SMA: No significant stenosis. Renals: Unremarkable. IMA: Patent. Iliac arteries: Unremarkable. Veins: Unremarkable. Review of the MIP images confirms the above findings. NON-VASCULAR Hepatobiliary: No focal abnormality is seen. There is no dilation of bile ducts. Gallbladder is unremarkable. Pancreas: No focal abnormality is seen. Spleen: Unremarkable. Adrenals/Urinary Tract: Adrenals are  unremarkable. There is no hydronephrosis. There are no renal or ureteral stones. Urinary bladder is unremarkable. Stomach/Bowel: Small hiatal hernia is seen. Small bowel loops are not dilated. Appendix is unremarkable. There is no significant wall thickening in colon. Lymphatic: No significant lymphadenopathy seen. Reproductive: Unremarkable. Other: There is no ascites or pneumoperitoneum. Umbilical hernia containing fat is seen. Musculoskeletal: Unremarkable. Review of the MIP images confirms the above findings. IMPRESSION: There is no evidence of dissection in the thoracic and abdominal aorta. Major branches of thoracic and abdominal aorta are patent. There is no mediastinal or retroperitoneal hematoma. 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 This recommendation follows 2010 ACCF/AHA/AATS/ACR/ASA/SCA/SCAI/SIR/STS/SVM Guidelines for the Diagnosis and Management of Patients with Thoracic Aortic Disease. Circulation. 2010; 121: U272-Z366: E266-e369. Aortic aneurysm NOS (ICD10-I71.9) There is no evidence of central pulmonary artery embolism. There is no focal pulmonary consolidation. There is no evidence of intestinal obstruction or pneumoperitoneum. There is no hydronephrosis. 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 Small hiatal hernia. Other findings as described in the body of the report. Electronically Signed   By: Ernie Avena M.D.   On: 06/08/2021 10:18   US SCROTUM W/DOPPLER  Result Date: 06/08/2021 CLINICAL DATA:  Concern for testicular torsion Abdominal pain, nausea, emesis EXAM: SCROTAL ULTRASOUND DOPPLER ULTRASOUND OF THE TESTICLES TECHNIQUE: Complete ultrasound examination of the testicles, epididymis, and other scrotal structures was performed. Color and spectral Doppler ultrasound were also utilized to evaluate blood flow to the testicles. COMPARISON:  CT angiography chest, abdomen, and pelvis 06/08/2021 FINDINGS: Right  testicle Measurements: 3.9 x 2.6 x 2.9 cm. No mass or microlithiasis visualized. Left testicle Measurements: 3.7 x 2.4 x 3.0 cm. 7 mm simple cyst of doubtful clinical significance. Right epididymis:  Normal in size and appearance. Left epididymis:  5 mm left epididymal cyst. Hydrocele:  None visualized. Varicocele:  None visualized. Pulsed Doppler interrogation of both testes demonstrates normal low resistance arterial and venous waveforms bilaterally. IMPRESSION: No significant sonographic abnormality of the scrotum. Electronically Signed   By: Acquanetta Belling M.D.   On: 06/08/2021 11:10      EKG: Normal sinus rhythm with new T wave inversions in T3.   Jerre Simon, MD 06/08/2021, 1:29 PM PGY-1, [facility] Family Medicine FPTS Intern pager: [phone], text pages welcome   FPTS Upper-Level Resident Addendum   I have independently interviewed and examined the patient. I have discussed the above with the original author and agree with their documentation. My edits for correction/addition/clarification are in black. Please see also any attending notes.   Towanda Octave MD PGY-3, [facility] Family Medicine 06/08/2021 7:45 PM  FPTS Service pager: [phone] (text pages welcome through [employer])

## 2021-06-08 NOTE — Consult Note (Signed)
CC/Reason for consult: Abdominal pain  Requesting Physician: Marda Stalker, MD  HPI: Kevin Callahan is an 41 y.o. male hx of HTN, HLD presented to the emergency department via EMS earlier today with on waking having developed dizziness that was significant.  He reports the room was spinning.  This caused him to feel nauseated and he began throwing up.  He also at that time developed abdominal pain.  The pain has been described as somewhat sharp and radiates to the flank.  Nothing seems to make it better or worse.  He denies any sick contacts.  He denies any constipation or diarrhea.  He denies any blood in his stool.  He reports his stools have been brown in color.  The emesis that he has had has been nonbloody and more of a brownish liquid color.  He tried taking gingerroot this morning but that did not seem to help.  He states he is never experienced any like this before.  He is here with one of his friend/neighbor.  They state he worked hard yesterday and had a long day. Had chips and a cookie last night and they were questioning if this could be related.  Past Medical History:  Diagnosis Date   Hyperlipemia    Hypertension     Past Surgical History:  Procedure Laterality Date   NO PAST SURGERIES      No family history on file.  Social:  reports that he has quit smoking. His smoking use included cigarettes. He smoked an average of .25 packs per day. He has never used smokeless tobacco. He reports current alcohol use. He reports that he does not use drugs.  Allergies: No Known Allergies  Medications: I have reviewed the patient's current medications.  Results for orders placed or performed during the hospital encounter of 06/08/21 (from the past 48 hour(s))  Comprehensive metabolic panel     Status: Abnormal   Collection Time: 06/08/21  9:04 AM  Result Value Ref Range   Sodium 139 135 - 145 mmol/L   Potassium 3.2 (L) 3.5 - 5.1 mmol/L   Chloride 106 98 - 111 mmol/L   CO2  17 (L) 22 - 32 mmol/L   Glucose, Bld 205 (H) 70 - 99 mg/dL    Comment: Glucose reference range applies only to samples taken after fasting for at least 8 hours.   BUN 6 6 - 20 mg/dL   Creatinine, Ser 1.09 0.61 - 1.24 mg/dL   Calcium 9.3 8.9 - 10.3 mg/dL   Total Protein 6.9 6.5 - 8.1 g/dL   Albumin 4.2 3.5 - 5.0 g/dL   AST 25 15 - 41 U/L   ALT 20 0 - 44 U/L   Alkaline Phosphatase 66 38 - 126 U/L   Total Bilirubin 0.4 0.3 - 1.2 mg/dL   GFR, Estimated >60 >60 mL/min    Comment: (NOTE) Calculated using the CKD-EPI Creatinine Equation (2021)    Anion gap 16 (H) 5 - 15    Comment: Performed at [facility] [address], [place], [place] [address]  CBC with Differential     Status: Abnormal   Collection Time: 06/08/21  9:04 AM  Result Value Ref Range   WBC 9.6 4.0 - 10.5 K/uL   RBC 5.33 4.22 - 5.81 MIL/uL   Hemoglobin 15.5 13.0 - 17.0 g/dL   HCT 44.2 39.0 - 52.0 %   MCV 82.9 80.0 - 100.0 fL   MCH 29.1 26.0 - 34.0 pg  MCHC 35.1 30.0 - 36.0 g/dL   RDW 36.4 68.0 - 32.1 %   Platelets 360 150 - 400 K/uL   nRBC 0.0 0.0 - 0.2 %   Neutrophils Relative % 60 %   Neutro Abs 5.8 1.7 - 7.7 K/uL   Lymphocytes Relative 31 %   Lymphs Abs 3.0 0.7 - 4.0 K/uL   Monocytes Relative 6 %   Monocytes Absolute 0.6 0.1 - 1.0 K/uL   Eosinophils Relative 1 %   Eosinophils Absolute 0.1 0.0 - 0.5 K/uL   Basophils Relative 0 %   Basophils Absolute 0.0 0.0 - 0.1 K/uL   Immature Granulocytes 2 %   Abs Immature Granulocytes 0.14 (H) 0.00 - 0.07 K/uL    Comment: Performed at [facility], [address], [place], [place] [address]  Lipase, blood     Status: None   Collection Time: 06/08/21  9:04 AM  Result Value Ref Range   Lipase 30 11 - 51 U/L    Comment: Performed at [facility], [address], [place], [place] [address]  Troponin I (High Sensitivity)     Status: None   Collection Time: 06/08/21  9:04 AM  Result Value Ref Range   Troponin I (High Sensitivity) 4 <18 ng/L     Comment: (NOTE) Elevated high sensitivity troponin I (hsTnI) values and significant  changes across serial measurements may suggest ACS but many other  chronic and acute conditions are known to elevate hsTnI results.  Refer to the "Links" section for chest pain algorithms and additional  guidance. Performed at [facility], [address], [place], [place] [address]   Lactic acid, plasma     Status: Abnormal   Collection Time: 06/08/21  9:06 AM  Result Value Ref Range   Lactic Acid, Venous 6.7 (HH) 0.5 - 1.9 mmol/L    Comment: CRITICAL RESULT CALLED TO, READ BACK BY AND VERIFIED WITH: Vela Prose, RN AT 1051 ON 5.20.2023 BY W SMITH Performed at [facility], [address], [place], [place] [address]   Urinalysis, Routine w reflex microscopic     Status: Abnormal   Collection Time: 06/08/21  9:33 AM  Result Value Ref Range   Color, Urine YELLOW YELLOW   APPearance CLEAR CLEAR   Specific Gravity, Urine 1.012 1.005 - 1.030   pH 5.0 5.0 - 8.0   Glucose, UA 50 (A) NEGATIVE mg/dL   Hgb urine dipstick NEGATIVE NEGATIVE   Bilirubin Urine NEGATIVE NEGATIVE   Ketones, ur 5 (A) NEGATIVE mg/dL   Protein, ur NEGATIVE NEGATIVE mg/dL   Nitrite NEGATIVE NEGATIVE   Leukocytes,Ua NEGATIVE NEGATIVE    Comment: Performed at [facility], [address], [place], [place] [address]  Rapid urine drug screen (hospital performed)     Status: Abnormal   Collection Time: 06/08/21  9:33 AM  Result Value Ref Range   Opiates POSITIVE (A) NONE DETECTED   Cocaine NONE DETECTED NONE DETECTED   Benzodiazepines NONE DETECTED NONE DETECTED   Amphetamines NONE DETECTED NONE DETECTED   Tetrahydrocannabinol NONE DETECTED NONE DETECTED   Barbiturates NONE DETECTED NONE DETECTED    Comment: (NOTE) DRUG SCREEN FOR MEDICAL PURPOSES ONLY.  IF CONFIRMATION IS NEEDED FOR ANY PURPOSE, NOTIFY LAB WITHIN 5 DAYS.  LOWEST DETECTABLE LIMITS FOR URINE DRUG SCREEN Drug Class                      Cutoff (ng/mL) Amphetamine and metabolites  1000 Barbiturate and metabolites    200 Benzodiazepine                 [id number] Tricyclics and metabolites     300 Opiates and metabolites        300 Cocaine and metabolites        300 THC                            50 Performed at [facility], [place] [address], [place], [place] [address]     DG Abdomen Acute W/Chest  Result Date: 06/08/2021 CLINICAL DATA:  Abdominal pain, nausea, vomiting EXAM: DG ABDOMEN ACUTE WITH 1 VIEW CHEST COMPARISON:  None Available. FINDINGS: Bowel gas pattern is nonspecific. Moderate amount of stool is seen in the colon. There is no fecal impaction in the rectosigmoid. No abnormal masses or calcifications are seen. Transverse diameter of heart is slightly increased. There are no signs of pulmonary edema or focal pulmonary consolidation. There is no pleural effusion or pneumothorax. IMPRESSION: Nonspecific bowel gas pattern. There are no focal pulmonary infiltrates. Electronically Signed   By: Elmer Picker M.D.   On: 06/08/2021 10:00   CT Angio Chest/Abd/Pel for Dissection W and/or Wo Contrast  Result Date: 06/08/2021 CLINICAL DATA:  Acute aortic syndrome suspected EXAM: CT ANGIOGRAPHY CHEST, ABDOMEN AND PELVIS TECHNIQUE: Non-contrast CT of the chest was initially obtained. Multidetector CT imaging through the chest, abdomen and pelvis was performed using the standard protocol during bolus administration of intravenous contrast. Multiplanar reconstructed images and MIPs were obtained and reviewed to evaluate the vascular anatomy. RADIATION DOSE REDUCTION: This exam was performed according to the departmental dose-optimization program which includes automated exposure control, adjustment of the mA and/or kV according to patient size and/or use of iterative reconstruction technique. CONTRAST:  181mL OMNIPAQUE IOHEXOL 350 MG/ML SOLN COMPARISON:  None Available. FINDINGS: CTA CHEST FINDINGS Cardiovascular: There is no mural  hematoma in thoracic aorta in the noncontrast images. There is no demonstrable intimal flap in the thoracic aorta. Major branches of thoracic aorta appear patent. 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 There are no intraluminal filling defects in the central pulmonary artery branches. Evaluation of small peripheral branches is limited in this examination tailored for evaluation of the systemic circulation. Mediastinum/Nodes: No significant lymphadenopathy seen. There is 8 mm low-density nodule in the left lobe of thyroid. Lungs/Pleura: There is small air-filled structure posterior to the trachea in the lower neck, possibly a diverticulum arising from the cervical esophagus. There is no evidence of any adjacent inflammation. There are no focal pulmonary infiltrates. There is no pleural effusion or pneumothorax. Musculoskeletal: Unremarkable. Review of the MIP images confirms the above findings. CTA ABDOMEN AND PELVIS FINDINGS VASCULAR Aorta: There is no evidence of dissection in the abdominal aorta. There is no focal aneurysmal dilation. Celiac: Normal. SMA: No significant stenosis. Renals: Unremarkable. IMA: Patent. Iliac arteries: Unremarkable. Veins: Unremarkable. Review of the MIP images confirms the above findings. NON-VASCULAR Hepatobiliary: No focal abnormality is seen. There is no dilation of bile ducts. Gallbladder is unremarkable. Pancreas: No focal abnormality is seen. Spleen: Unremarkable. Adrenals/Urinary Tract: Adrenals are unremarkable. There is no hydronephrosis. There are no renal or ureteral stones. Urinary bladder is unremarkable. Stomach/Bowel: Small hiatal hernia is seen. Small bowel loops are not dilated. Appendix is unremarkable. There is no significant wall thickening in colon. Lymphatic: No significant lymphadenopathy seen. Reproductive: Unremarkable. Other: There is no ascites  or pneumoperitoneum. Umbilical hernia containing fat is seen. Musculoskeletal: Unremarkable.  Review of the MIP images confirms the above findings. IMPRESSION: There is no evidence of dissection in the thoracic and abdominal aorta. Major branches of thoracic and abdominal aorta are patent. There is no mediastinal or retroperitoneal hematoma. 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 This recommendation follows 2010 ACCF/AHA/AATS/ACR/ASA/SCA/SCAI/SIR/STS/SVM Guidelines for the Diagnosis and Management of Patients with Thoracic Aortic Disease. Circulation. 2010; 121ML:[id number]. Aortic aneurysm NOS (ICD10-I71.9) There is no evidence of central pulmonary artery embolism. There is no focal pulmonary consolidation. There is no evidence of intestinal obstruction or pneumoperitoneum. There is no hydronephrosis. 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 Small hiatal hernia. Other findings as described in the body of the report. Electronically Signed   By: Elmer Picker M.D.   On: 06/08/2021 10:18   US SCROTUM W/DOPPLER  Result Date: 06/08/2021 CLINICAL DATA:  Concern for testicular torsion Abdominal pain, nausea, emesis EXAM: SCROTAL ULTRASOUND DOPPLER ULTRASOUND OF THE TESTICLES TECHNIQUE: Complete ultrasound examination of the testicles, epididymis, and other scrotal structures was performed. Color and spectral Doppler ultrasound were also utilized to evaluate blood flow to the testicles. COMPARISON:  CT angiography chest, abdomen, and pelvis 06/08/2021 FINDINGS: Right testicle Measurements: 3.9 x 2.6 x 2.9 cm. No mass or microlithiasis visualized. Left testicle Measurements: 3.7 x 2.4 x 3.0 cm. 7 mm simple cyst of doubtful clinical significance. Right epididymis:  Normal in size and appearance. Left epididymis:  5 mm left epididymal cyst. Hydrocele:  None visualized. Varicocele:  None visualized. Pulsed Doppler interrogation of both testes demonstrates normal low resistance arterial and venous waveforms bilaterally.  IMPRESSION: No significant sonographic abnormality of the scrotum. Electronically Signed   By: Miachel Roux M.D.   On: 06/08/2021 11:10    ROS - all of the below systems have been reviewed with the patient and positives are indicated with bold text General: chills, fever or night sweats Eyes: blurry vision or double vision ENT: epistaxis or dizziness Allergy/Immunology: itchy/watery eyes or nasal congestion Hematologic/Lymphatic: bleeding problems, blood clots or swollen lymph nodes Endocrine: temperature intolerance or unexpected weight changes Breast: new or changing breast lumps or nipple discharge Resp: cough, shortness of breath, or wheezing CV: chest pain or dyspnea on exertion GI: as per HPI GU: dysuria, trouble voiding, or hematuria MSK: joint pain or joint stiffness Neuro: TIA or stroke symptoms Derm: pruritus and skin lesion changes Psych: anxiety and depression  PE Blood pressure (!) 168/102, pulse 83, temperature 98 F (36.7 C), temperature source Oral, resp. rate 18, height 5\' 7"  (1.702 m), weight 88.5 kg, SpO2 100 %. Constitutional: NAD; conversant Eyes: Moist conjunctiva; no lid lag; anicteric Lungs: Normal respiratory effort CV: RRR; no pitting edema GI: Abd soft, mildly tender throughout without rebound nor guarding; not significantly distended; no palpable hepatosplenomegaly MSK: Normal range of motion of extremities; no clubbing/cyanosis Psychiatric: Appropriate affect; alert and oriented x3 Lymphatic: No palpable cervical or axillary lymphadenopathy  Results for orders placed or performed during the hospital encounter of 06/08/21 (from the past 48 hour(s))  Comprehensive metabolic panel     Status: Abnormal   Collection Time: 06/08/21  9:04 AM  Result Value Ref Range   Sodium 139 135 - 145 mmol/L   Potassium 3.2 (L) 3.5 - 5.1 mmol/L   Chloride 106 98 - 111 mmol/L   CO2 17 (L) 22 - 32 mmol/L   Glucose, Bld 205 (H) 70 -  99 mg/dL    Comment: Glucose reference  range applies only to samples taken after fasting for at least 8 hours.   BUN 6 6 - 20 mg/dL   Creatinine, Ser 1.09 0.61 - 1.24 mg/dL   Calcium 9.3 8.9 - 10.3 mg/dL   Total Protein 6.9 6.5 - 8.1 g/dL   Albumin 4.2 3.5 - 5.0 g/dL   AST 25 15 - 41 U/L   ALT 20 0 - 44 U/L   Alkaline Phosphatase 66 38 - 126 U/L   Total Bilirubin 0.4 0.3 - 1.2 mg/dL   GFR, Estimated >60 >60 mL/min    Comment: (NOTE) Calculated using the CKD-EPI Creatinine Equation (2021)    Anion gap 16 (H) 5 - 15    Comment: Performed at [facility] [address], [place], [place] [address]  CBC with Differential     Status: Abnormal   Collection Time: 06/08/21  9:04 AM  Result Value Ref Range   WBC 9.6 4.0 - 10.5 K/uL   RBC 5.33 4.22 - 5.81 MIL/uL   Hemoglobin 15.5 13.0 - 17.0 g/dL   HCT 44.2 39.0 - 52.0 %   MCV 82.9 80.0 - 100.0 fL   MCH 29.1 26.0 - 34.0 pg   MCHC 35.1 30.0 - 36.0 g/dL   RDW 12.6 11.5 - 15.5 %   Platelets 360 150 - 400 K/uL   nRBC 0.0 0.0 - 0.2 %   Neutrophils Relative % 60 %   Neutro Abs 5.8 1.7 - 7.7 K/uL   Lymphocytes Relative 31 %   Lymphs Abs 3.0 0.7 - 4.0 K/uL   Monocytes Relative 6 %   Monocytes Absolute 0.6 0.1 - 1.0 K/uL   Eosinophils Relative 1 %   Eosinophils Absolute 0.1 0.0 - 0.5 K/uL   Basophils Relative 0 %   Basophils Absolute 0.0 0.0 - 0.1 K/uL   Immature Granulocytes 2 %   Abs Immature Granulocytes 0.14 (H) 0.00 - 0.07 K/uL    Comment: Performed at [facility], [address], [place], [place] [address]  Lipase, blood     Status: None   Collection Time: 06/08/21  9:04 AM  Result Value Ref Range   Lipase 30 11 - 51 U/L    Comment: Performed at [facility] [address], [place], [place] [address]  Troponin I (High Sensitivity)     Status: None   Collection Time: 06/08/21  9:04 AM  Result Value Ref Range   Troponin I (High Sensitivity) 4 <18 ng/L    Comment: (NOTE) Elevated high sensitivity troponin I (hsTnI) values and significant  changes  across serial measurements may suggest ACS but many other  chronic and acute conditions are known to elevate hsTnI results.  Refer to the "Links" section for chest pain algorithms and additional  guidance. Performed at [facility], [place] [address] [place], [place] [address]   Lactic acid, plasma     Status: Abnormal   Collection Time: 06/08/21  9:06 AM  Result Value Ref Range   Lactic Acid, Venous 6.7 (HH) 0.5 - 1.9 mmol/L    Comment: CRITICAL RESULT CALLED TO, READ BACK BY AND VERIFIED WITH: Jene Every, RN AT [id number] ON 5.20.2023 BY W SMITH Performed at [facility], [address] [place], [place] [address]   Urinalysis, Routine w reflex microscopic     Status: Abnormal   Collection Time: 06/08/21  9:33 AM  Result Value Ref Range  Color, Urine YELLOW YELLOW   APPearance CLEAR CLEAR   Specific Gravity, Urine 1.012 1.005 - 1.030   pH 5.0 5.0 - 8.0   Glucose, UA 50 (A) NEGATIVE mg/dL   Hgb urine dipstick NEGATIVE NEGATIVE   Bilirubin Urine NEGATIVE NEGATIVE   Ketones, ur 5 (A) NEGATIVE mg/dL   Protein, ur NEGATIVE NEGATIVE mg/dL   Nitrite NEGATIVE NEGATIVE   Leukocytes,Ua NEGATIVE NEGATIVE    Comment: Performed at [facility] [address], [place], [place] [address]  Rapid urine drug screen (hospital performed)     Status: Abnormal   Collection Time: 06/08/21  9:33 AM  Result Value Ref Range   Opiates POSITIVE (A) NONE DETECTED   Cocaine NONE DETECTED NONE DETECTED   Benzodiazepines NONE DETECTED NONE DETECTED   Amphetamines NONE DETECTED NONE DETECTED   Tetrahydrocannabinol NONE DETECTED NONE DETECTED   Barbiturates NONE DETECTED NONE DETECTED    Comment: (NOTE) DRUG SCREEN FOR MEDICAL PURPOSES ONLY.  IF CONFIRMATION IS NEEDED FOR ANY PURPOSE, NOTIFY LAB WITHIN 5 DAYS.  LOWEST DETECTABLE LIMITS FOR URINE DRUG SCREEN Drug Class                     Cutoff (ng/mL) Amphetamine and metabolites    1000 Barbiturate and metabolites     200 Benzodiazepine                 [id number] Tricyclics and metabolites     300 Opiates and metabolites        300 Cocaine and metabolites        300 THC                            50 Performed at [facility], [place] [address] [place], [place] [address]     DG Abdomen Acute W/Chest  Result Date: 06/08/2021 CLINICAL DATA:  Abdominal pain, nausea, vomiting EXAM: DG ABDOMEN ACUTE WITH 1 VIEW CHEST COMPARISON:  None Available. FINDINGS: Bowel gas pattern is nonspecific. Moderate amount of stool is seen in the colon. There is no fecal impaction in the rectosigmoid. No abnormal masses or calcifications are seen. Transverse diameter of heart is slightly increased. There are no signs of pulmonary edema or focal pulmonary consolidation. There is no pleural effusion or pneumothorax. IMPRESSION: Nonspecific bowel gas pattern. There are no focal pulmonary infiltrates. Electronically Signed   By: Elmer Picker M.D.   On: 06/08/2021 10:00   CT Angio Chest/Abd/Pel for Dissection W and/or Wo Contrast  Result Date: 06/08/2021 CLINICAL DATA:  Acute aortic syndrome suspected EXAM: CT ANGIOGRAPHY CHEST, ABDOMEN AND PELVIS TECHNIQUE: Non-contrast CT of the chest was initially obtained. Multidetector CT imaging through the chest, abdomen and pelvis was performed using the standard protocol during bolus administration of intravenous contrast. Multiplanar reconstructed images and MIPs were obtained and reviewed to evaluate the vascular anatomy. RADIATION DOSE REDUCTION: This exam was performed according to the departmental dose-optimization program which includes automated exposure control, adjustment of the mA and/or kV according to patient size and/or use of iterative reconstruction technique. CONTRAST:  141mL OMNIPAQUE IOHEXOL 350 MG/ML SOLN COMPARISON:  None Available. FINDINGS: CTA CHEST FINDINGS Cardiovascular: There is no mural hematoma in thoracic aorta in the noncontrast images. There is no demonstrable  intimal flap in the thoracic aorta. Major branches of thoracic aorta appear patent. 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 There are no intraluminal filling defects in the  central pulmonary artery branches. Evaluation of small peripheral branches is limited in this examination tailored for evaluation of the systemic circulation. Mediastinum/Nodes: No significant lymphadenopathy seen. There is 8 mm low-density nodule in the left lobe of thyroid. Lungs/Pleura: There is small air-filled structure posterior to the trachea in the lower neck, possibly a diverticulum arising from the cervical esophagus. There is no evidence of any adjacent inflammation. There are no focal pulmonary infiltrates. There is no pleural effusion or pneumothorax. Musculoskeletal: Unremarkable. Review of the MIP images confirms the above findings. CTA ABDOMEN AND PELVIS FINDINGS VASCULAR Aorta: There is no evidence of dissection in the abdominal aorta. There is no focal aneurysmal dilation. Celiac: Normal. SMA: No significant stenosis. Renals: Unremarkable. IMA: Patent. Iliac arteries: Unremarkable. Veins: Unremarkable. Review of the MIP images confirms the above findings. NON-VASCULAR Hepatobiliary: No focal abnormality is seen. There is no dilation of bile ducts. Gallbladder is unremarkable. Pancreas: No focal abnormality is seen. Spleen: Unremarkable. Adrenals/Urinary Tract: Adrenals are unremarkable. There is no hydronephrosis. There are no renal or ureteral stones. Urinary bladder is unremarkable. Stomach/Bowel: Small hiatal hernia is seen. Small bowel loops are not dilated. Appendix is unremarkable. There is no significant wall thickening in colon. Lymphatic: No significant lymphadenopathy seen. Reproductive: Unremarkable. Other: There is no ascites or pneumoperitoneum. Umbilical hernia containing fat is seen. Musculoskeletal: Unremarkable. Review of the MIP images confirms the above findings. IMPRESSION: There is no  evidence of dissection in the thoracic and abdominal aorta. Major branches of thoracic and abdominal aorta are patent. There is no mediastinal or retroperitoneal hematoma. 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 This recommendation follows 2010 ACCF/AHA/AATS/ACR/ASA/SCA/SCAI/SIR/STS/SVM Guidelines for the Diagnosis and Management of Patients with Thoracic Aortic Disease. Circulation. 2010; 121ML:[id number]. Aortic aneurysm NOS (ICD10-I71.9) There is no evidence of central pulmonary artery embolism. There is no focal pulmonary consolidation. There is no evidence of intestinal obstruction or pneumoperitoneum. There is no hydronephrosis. 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 Small hiatal hernia. Other findings as described in the body of the report. Electronically Signed   By: Elmer Picker M.D.   On: 06/08/2021 10:18   US SCROTUM W/DOPPLER  Result Date: 06/08/2021 CLINICAL DATA:  Concern for testicular torsion Abdominal pain, nausea, emesis EXAM: SCROTAL ULTRASOUND DOPPLER ULTRASOUND OF THE TESTICLES TECHNIQUE: Complete ultrasound examination of the testicles, epididymis, and other scrotal structures was performed. Color and spectral Doppler ultrasound were also utilized to evaluate blood flow to the testicles. COMPARISON:  CT angiography chest, abdomen, and pelvis 06/08/2021 FINDINGS: Right testicle Measurements: 3.9 x 2.6 x 2.9 cm. No mass or microlithiasis visualized. Left testicle Measurements: 3.7 x 2.4 x 3.0 cm. 7 mm simple cyst of doubtful clinical significance. Right epididymis:  Normal in size and appearance. Left epididymis:  5 mm left epididymal cyst. Hydrocele:  None visualized. Varicocele:  None visualized. Pulsed Doppler interrogation of both testes demonstrates normal low resistance arterial and venous waveforms bilaterally. IMPRESSION: No significant sonographic abnormality of the scrotum. Electronically  Signed   By: Miachel Roux M.D.   On: 06/08/2021 11:10    I have personally reviewed the relevant CTA Chest/abd/pelvis 06/08/21; CBC, CMP, lactate 06/08/2021  A/P: Kevin Callahan is an 41 y.o. male with HTN, HLD with acute onset dizziness --> nausea, vomiting, abdominal pain; lactic acidosis  -Agree with admission for at least observation and further resuscitation -Normal WBC, afebrile, normal vital signs.  He does have mild tenderness throughout but no generalized  peritoneal type signs.  His CT scan demonstrates no evident intra-abdominal pathology including any sort of obstructive process, any evident hernia, and widely patent mesenteric vasculature. -We will follow with you, please let us know if anything were to change in interim  I spent a total of 60 minutes in both face-to-face and non-face-to-face activities, excluding procedures performed, for this visit on the date of this encounter.  Nadeen Landau, [facility], [place]

## 2021-06-08 NOTE — ED Provider Notes (Signed)
[facility] EMERGENCY DEPARTMENT Provider Note   CSN: [id number] Arrival date & time: 06/08/21  [id number]     History  Chief Complaint  Patient presents with   Abdominal Pain   Nausea    Kevin Callahan is a 41 y.o. male with a history of hypertension and hyperlipidemia.  Presents emergency department with with a chief complaint of abdominal pain, nausea, vomiting, and dizziness.  Patient reports that he woke this morning and had some dizziness.  States that he a had a sudden onset of nausea, vomiting, and abdominal pain approximate 40 minutes ago.  Pain has been constant since then.  Patient states that he vomited once this morning.  Emesis was described as stomach contents.  Patient reports pain to upper abdomen.  Describes pain as a "pinching."  Reports that pain radiates to bilateral flanks.  Reports no improvement with pain with Zofran or fentanyl given by EMS.  Patient denies any  blood in stool, melena, hematemesis, coffee-ground emesis, constipation, diarrhea dysuria, hematuria, urinary urgency, swelling or tenderness genitals, genital sores or lesions, chest pain, shortness of breath, lightheadedness, syncope.  Patient denies any illicit drug use or alcohol use.  Denies any history of abdominal surgeries.   Abdominal Pain Associated symptoms: nausea and vomiting   Associated symptoms: no chest pain, no chills, no constipation, no diarrhea, no dysuria, no fever, no hematuria and no shortness of breath       Home Medications Prior to Admission medications   Medication Sig Start Date End Date Taking? Authorizing Provider  benzonatate (TESSALON) 100 MG capsule Take 1 capsule (100 mg total) by mouth 3 (three) times daily as needed for cough. 12/10/15   Shelda Pal, DO  cetirizine (ZYRTEC) 10 MG tablet Take 1 tablet (10 mg total) by mouth at bedtime as needed for allergies. 06/16/12   Moreno-Coll, Adlih, MD  ibuprofen (ADVIL,MOTRIN) 600 MG tablet Take 1  tablet (600 mg total) by mouth every 8 (eight) hours as needed. 11/16/15   Shelda Pal, DO      Allergies    Patient has no known allergies.    Review of Systems   Review of Systems  Constitutional:  Positive for diaphoresis. Negative for chills and fever.  Eyes:  Negative for visual disturbance.  Respiratory:  Negative for shortness of breath.   Cardiovascular:  Negative for chest pain.  Gastrointestinal:  Positive for abdominal distention, abdominal pain, nausea and vomiting. Negative for anal bleeding, blood in stool, constipation, diarrhea and rectal pain.  Genitourinary:  Negative for difficulty urinating, dysuria, frequency, genital sores, hematuria, penile discharge, penile pain, penile swelling and scrotal swelling.  Musculoskeletal:  Negative for back pain and neck pain.  Skin:  Negative for color change and rash.  Neurological:  Positive for dizziness. Negative for syncope, light-headedness and headaches.  Psychiatric/Behavioral:  Negative for confusion.    Physical Exam Updated Vital Signs BP (!) 162/99 (BP Location: Left Arm)   Pulse 82   Resp (!) 25   Ht 5\' 7"  (1.702 m)   Wt 88.5 kg   SpO2 100%   BMI 30.54 kg/m  Physical Exam Vitals and nursing note reviewed.  Constitutional:      General: He is in acute distress.     Appearance: He is diaphoretic. He is not ill-appearing or toxic-appearing.     Comments: Writhing in pain structure  HENT:     Head: Normocephalic.  Eyes:     General: No scleral icterus.  Right eye: No discharge.        Left eye: No discharge.  Cardiovascular:     Rate and Rhythm: Normal rate.     Pulses:          Radial pulses are 2+ on the right side and 2+ on the left side.       Dorsalis pedis pulses are 2+ on the right side and 2+ on the left side.  Pulmonary:     Effort: Pulmonary effort is normal. Tachypnea present. No bradypnea or respiratory distress.     Breath sounds: Normal breath sounds. No stridor.  Abdominal:      General: Abdomen is protuberant. Bowel sounds are normal. There is distension. There are no signs of injury.     Palpations: Abdomen is soft. There is no shifting dullness, mass or pulsatile mass.     Tenderness: There is abdominal tenderness in the right upper quadrant, epigastric area and left upper quadrant. There is no right CVA tenderness, left CVA tenderness, guarding or rebound.     Hernia: There is no hernia in the umbilical area or ventral area.  Skin:    General: Skin is warm.  Neurological:     General: No focal deficit present.     Mental Status: He is alert.  Psychiatric:        Behavior: Behavior is cooperative.    ED Results / Procedures / Treatments   Labs (all labs ordered are listed, but only abnormal results are displayed) Labs Reviewed  COMPREHENSIVE METABOLIC PANEL - Abnormal; Notable for the following components:      Result Value   Potassium 3.2 (*)    CO2 17 (*)    Glucose, Bld 205 (*)    Anion gap 16 (*)    All other components within normal limits  CBC WITH DIFFERENTIAL/PLATELET - Abnormal; Notable for the following components:   Abs Immature Granulocytes 0.14 (*)    All other components within normal limits  URINALYSIS, ROUTINE W REFLEX MICROSCOPIC - Abnormal; Notable for the following components:   Glucose, UA 50 (*)    Ketones, ur 5 (*)    All other components within normal limits  RAPID URINE DRUG SCREEN, HOSP PERFORMED - Abnormal; Notable for the following components:   Opiates POSITIVE (*)    All other components within normal limits  LACTIC ACID, PLASMA - Abnormal; Notable for the following components:   Lactic Acid, Venous 6.7 (*)    All other components within normal limits  LIPASE, BLOOD  LACTIC ACID, PLASMA  TROPONIN I (HIGH SENSITIVITY)  TROPONIN I (HIGH SENSITIVITY)    EKG EKG Interpretation  Date/Time:  Saturday Jun 08 2021 08:54:53 EDT Ventricular Rate:  85 PR Interval:  141 QRS Duration: 106 QT Interval:  382 QTC  Calculation: 455 R Axis:   55 Text Interpretation: Sinus rhythm Borderline repolarization abnormality Baseline wander in lead(s) V2 V4 V5 when compared to prior, new t wave inversion in lead v3. No STEMI Confirmed by Theda Belfastegeler, Chris ([id number]) on 06/08/2021 9:21:23 AM  Radiology DG Abdomen Acute W/Chest  Result Date: 06/08/2021 CLINICAL DATA:  Abdominal pain, nausea, vomiting EXAM: DG ABDOMEN ACUTE WITH 1 VIEW CHEST COMPARISON:  None Available. FINDINGS: Bowel gas pattern is nonspecific. Moderate amount of stool is seen in the colon. There is no fecal impaction in the rectosigmoid. No abnormal masses or calcifications are seen. Transverse diameter of heart is slightly increased. There are no signs of pulmonary edema or focal pulmonary consolidation. There is  no pleural effusion or pneumothorax. IMPRESSION: Nonspecific bowel gas pattern. There are no focal pulmonary infiltrates. Electronically Signed   By: Elmer Picker M.D.   On: 06/08/2021 10:00   CT Angio Chest/Abd/Pel for Dissection W and/or Wo Contrast  Result Date: 06/08/2021 CLINICAL DATA:  Acute aortic syndrome suspected EXAM: CT ANGIOGRAPHY CHEST, ABDOMEN AND PELVIS TECHNIQUE: Non-contrast CT of the chest was initially obtained. Multidetector CT imaging through the chest, abdomen and pelvis was performed using the standard protocol during bolus administration of intravenous contrast. Multiplanar reconstructed images and MIPs were obtained and reviewed to evaluate the vascular anatomy. RADIATION DOSE REDUCTION: This exam was performed according to the departmental dose-optimization program which includes automated exposure control, adjustment of the mA and/or kV according to patient size and/or use of iterative reconstruction technique. CONTRAST:  159mL OMNIPAQUE IOHEXOL 350 MG/ML SOLN COMPARISON:  None Available. FINDINGS: CTA CHEST FINDINGS Cardiovascular: There is no mural hematoma in thoracic aorta in the noncontrast images. There is no  demonstrable intimal flap in the thoracic aorta. Major branches of thoracic aorta appear patent. 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 There are no intraluminal filling defects in the central pulmonary artery branches. Evaluation of small peripheral branches is limited in this examination tailored for evaluation of the systemic circulation. Mediastinum/Nodes: No significant lymphadenopathy seen. There is 8 mm low-density nodule in the left lobe of thyroid. Lungs/Pleura: There is small air-filled structure posterior to the trachea in the lower neck, possibly a diverticulum arising from the cervical esophagus. There is no evidence of any adjacent inflammation. There are no focal pulmonary infiltrates. There is no pleural effusion or pneumothorax. Musculoskeletal: Unremarkable. Review of the MIP images confirms the above findings. CTA ABDOMEN AND PELVIS FINDINGS VASCULAR Aorta: There is no evidence of dissection in the abdominal aorta. There is no focal aneurysmal dilation. Celiac: Normal. SMA: No significant stenosis. Renals: Unremarkable. IMA: Patent. Iliac arteries: Unremarkable. Veins: Unremarkable. Review of the MIP images confirms the above findings. NON-VASCULAR Hepatobiliary: No focal abnormality is seen. There is no dilation of bile ducts. Gallbladder is unremarkable. Pancreas: No focal abnormality is seen. Spleen: Unremarkable. Adrenals/Urinary Tract: Adrenals are unremarkable. There is no hydronephrosis. There are no renal or ureteral stones. Urinary bladder is unremarkable. Stomach/Bowel: Small hiatal hernia is seen. Small bowel loops are not dilated. Appendix is unremarkable. There is no significant wall thickening in colon. Lymphatic: No significant lymphadenopathy seen. Reproductive: Unremarkable. Other: There is no ascites or pneumoperitoneum. Umbilical hernia containing fat is seen. Musculoskeletal: Unremarkable. Review of the MIP images confirms the above findings. IMPRESSION:  There is no evidence of dissection in the thoracic and abdominal aorta. Major branches of thoracic and abdominal aorta are patent. There is no mediastinal or retroperitoneal hematoma. 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 This recommendation follows 2010 ACCF/AHA/AATS/ACR/ASA/SCA/SCAI/SIR/STS/SVM Guidelines for the Diagnosis and Management of Patients with Thoracic Aortic Disease. Circulation. 2010; [id number]. Aortic aneurysm NOS (ICD10-I71.9) There is no evidence of central pulmonary artery embolism. There is no focal pulmonary consolidation. There is no evidence of intestinal obstruction or pneumoperitoneum. There is no hydronephrosis. 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 Small hiatal hernia. Other findings as described in the body of the report. Electronically Signed   By: Elmer Picker M.D.   On: 06/08/2021 10:18   US SCROTUM W/DOPPLER  Result Date: 06/08/2021 CLINICAL DATA:  Concern for testicular torsion Abdominal pain, nausea, emesis EXAM: SCROTAL ULTRASOUND DOPPLER  ULTRASOUND OF THE TESTICLES TECHNIQUE: Complete ultrasound examination of the testicles, epididymis, and other scrotal structures was performed. Color and spectral Doppler ultrasound were also utilized to evaluate blood flow to the testicles. COMPARISON:  CT angiography chest, abdomen, and pelvis 06/08/2021 FINDINGS: Right testicle Measurements: 3.9 x 2.6 x 2.9 cm. No mass or microlithiasis visualized. Left testicle Measurements: 3.7 x 2.4 x 3.0 cm. 7 mm simple cyst of doubtful clinical significance. Right epididymis:  Normal in size and appearance. Left epididymis:  5 mm left epididymal cyst. Hydrocele:  None visualized. Varicocele:  None visualized. Pulsed Doppler interrogation of both testes demonstrates normal low resistance arterial and venous waveforms bilaterally. IMPRESSION: No significant sonographic abnormality of the scrotum.  Electronically Signed   By: Miachel Roux M.D.   On: 06/08/2021 11:10    Procedures Procedures    Medications Ordered in ED Medications  sodium chloride 0.9 % bolus 1,000 mL (0 mLs Intravenous Stopped 06/08/21 1026)  morphine (PF) 4 MG/ML injection 4 mg (4 mg Intravenous Given 06/08/21 0915)  promethazine (PHENERGAN) 25 mg in sodium chloride 0.9 % 50 mL IVPB (0 mg Intravenous Stopped 06/08/21 1026)  HYDROmorphone (DILAUDID) injection 1 mg (1 mg Intravenous Given 06/08/21 1003)  iohexol (OMNIPAQUE) 350 MG/ML injection 100 mL (100 mLs Intravenous Contrast Given 06/08/21 0958)  lactated ringers bolus 1,500 mL (1,500 mLs Intravenous New Bag/Given 06/08/21 1123)    ED Course/ Medical Decision Making/ A&P Clinical Course as of 06/08/21 1641  Sat Jun 08, 2021  1159 I spoke to general surgeon Dr. Dema Severin who will see the patient for consult. [PB]  [id number] I spoke with internal medicine admitting team who will see the patient for admission. [PB]    Clinical Course User Index [PB] Loni Beckwith, PA-C                           Medical Decision Making Amount and/or Complexity of Data Reviewed Labs: ordered. Radiology: ordered.  Risk Prescription drug management. Decision regarding hospitalization.   Alert 41 year old male in acute distress, diaphoretic writhing in pain on stretcher.  Presents to the ED with a chief complaint of abdominal pain, nausea, and vomiting.  Information obtained from patient and patient's friends at bedside.  Past medical records were reviewed including previous provider notes, labs, imaging.  Patient has medical history as outlined in HPI which complicates his care.  Due to patient's presentation and sudden onset of abdominal pain there is concern for bowel perforation, aortic aneurysm/dissection, or ACS event.  Will obtain CTA chest abdomen pelvis with contrast due to high clinical suspicion will obtain this before we obtain CTA chest abdomen pelvis prior to  obtaining creatinine.  We will give patient Dilaudid and Phenergan for pain management.  Patient was discussed with and evaluated by Dr. Sherry Ruffing.  I personally viewed and interpreted patient's EKG.  Tracing shows sinus rhythm with new T wave inversion in V3.  I personally viewed and interpreted patient's CT abdomen acute with chest which showed nonspecific bowel gas pattern.  No focal pulmonary infiltrates.  I personally viewed and interpreted patient's CTA chest abdomen pelvis.  Agree with radiology interpretation of: -No evidence of dissection or thoracic and abdominal aorta.  Major abrasions of the thoracic and abdominal aorta patent. 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 -Small hiatal hernia.  On CTA the patient's right testicle was high riding, on  my GU exam with cremasteric reflex was not present.  Will obtain scrotal ultrasound to evaluate for possible torsion.  Ultrasound imaging did not show any signs of torsion.  I personally viewed and interpreted patient's lab results.  Pertinent findings include: -UDS positive for opiates however this was obtained after patient had received pain medication in the emergency department -Lactic acid 6.7 -Anion gap 16, bicarb 17 -Troponin 4  On serial reexamination patient continues to report abdominal pain however is sleeping comfortably.  Patient does have continued tenderness to the upper abdomen.  Due to elevated lactic acid we will give patient further fluid bolus.  With elevated lactic acid and patient's exquisite tenderness on exam will consult general surgery.  I spoke with Dr. Dema Severin who will see the patient for consult.  Dr. Dema Severin advised does medications for surgery at this time however he will continue to consult.  We will consult hospital team for admission due to increased lactic acid further pain  management.           Final Clinical Impression(s) / ED Diagnoses Final diagnoses:  Pain of upper abdomen  Lactic acid increased    Rx / DC Orders ED Discharge Orders     None         Dyann Ruddle 06/08/21 1642    Tegeler, Gwenyth Allegra, MD 06/10/21 1134

## 2021-06-08 NOTE — ED Notes (Signed)
Pt very restless and moaning in pain. Reports his head his spinning.

## 2021-06-08 NOTE — Progress Notes (Signed)
[facility]PTS Interim Night Progress Note  S:Patient sleeping comfortably.  Rounded with primary night RN.  No concerns voiced.  No orders required.    O: Today's Vitals   06/08/21 1607 06/08/21 1737 06/08/21 1943 06/08/21 2002  BP: (!) 154/91   (!) 152/88  Pulse: 70   76  Resp: 18   18  Temp: 98.1 F (36.7 C)   98 F (36.7 C)  TempSrc: Oral   Oral  SpO2: 100%   100%  Weight:      Height:      PainSc:  7  7        A/P: Continue current management  Dana Allan MD PGY-3, [facility] Family Medicine Service pager [phone]

## 2021-06-08 NOTE — ED Triage Notes (Signed)
Pt arrives via EMS from home with complaints of abd pain, nausea and emesis since last night. Pt dry heaving on arrival. EMS gave 50 fentanyl and 4 mg zofran.

## 2021-06-08 NOTE — Plan of Care (Signed)
  Problem: Nutrition: Goal: Adequate nutrition will be maintained Outcome: Progressing   Problem: Pain Managment: Goal: General experience of comfort will improve Outcome: Progressing   Problem: Safety: Goal: Ability to remain free from injury will improve Outcome: Progressing   

## 2021-06-09 ENCOUNTER — Other Ambulatory Visit: Payer: Self-pay

## 2021-06-09 ENCOUNTER — Observation Stay (HOSPITAL_COMMUNITY): Payer: 59

## 2021-06-09 ENCOUNTER — Encounter (HOSPITAL_COMMUNITY): Payer: Self-pay | Admitting: Family Medicine

## 2021-06-09 DIAGNOSIS — E872 Acidosis, unspecified: Secondary | ICD-10-CM | POA: Diagnosis not present

## 2021-06-09 DIAGNOSIS — R1011 Right upper quadrant pain: Secondary | ICD-10-CM | POA: Diagnosis not present

## 2021-06-09 DIAGNOSIS — R1010 Upper abdominal pain, unspecified: Secondary | ICD-10-CM | POA: Diagnosis not present

## 2021-06-09 LAB — COMPREHENSIVE METABOLIC PANEL
ALT: 17 U/L (ref 0–44)
AST: 15 U/L (ref 15–41)
Albumin: 3.5 g/dL (ref 3.5–5.0)
Alkaline Phosphatase: 53 U/L (ref 38–126)
Anion gap: 9 (ref 5–15)
BUN: <5 mg/dL — ABNORMAL LOW (ref 6–20)
CO2: 23 mmol/L (ref 22–32)
Calcium: 8.7 mg/dL — ABNORMAL LOW (ref 8.9–10.3)
Chloride: 106 mmol/L (ref 98–111)
Creatinine, Ser: 0.8 mg/dL (ref 0.61–1.24)
GFR, Estimated: >60 mL/min (ref >60)
Glucose, Bld: 110 mg/dL — ABNORMAL HIGH (ref 70–99)
Potassium: 3.7 mmol/L (ref 3.5–5.1)
Sodium: 138 mmol/L (ref 135–145)
Total Bilirubin: 0.8 mg/dL (ref 0.3–1.2)
Total Protein: 6.1 g/dL — ABNORMAL LOW (ref 6.5–8.1)

## 2021-06-09 LAB — LIPID PANEL
Cholesterol: 159 mg/dL (ref 0–200)
HDL: 47 mg/dL (ref >40)
LDL Cholesterol: 102 mg/dL — ABNORMAL HIGH (ref 0–99)
Total CHOL/HDL Ratio: 3.4 RATIO
Triglycerides: 49 mg/dL (ref <150)
VLDL: 10 mg/dL (ref 0–40)

## 2021-06-09 LAB — LACTIC ACID, PLASMA
Lactic Acid, Venous: 1 mmol/L (ref 0.5–1.9)
Lactic Acid, Venous: 2.1 mmol/L (ref 0.5–1.9)

## 2021-06-09 MED ORDER — POLYETHYLENE GLYCOL 3350 17 G PO PACK
17.0000 g | PACK | Freq: Two times a day (BID) | ORAL | Status: DC
  Administered 2021-06-09: 17 g via ORAL
  Filled 2021-06-09 (×2): qty 1

## 2021-06-09 MED ORDER — MECLIZINE HCL 12.5 MG PO TABS: 12.5000 mg | ORAL_TABLET | Freq: Two times a day (BID) | ORAL | Status: DC | PRN

## 2021-06-09 MED ORDER — MINERAL OIL RE ENEM
1.0000 | ENEMA | Freq: Once | RECTAL | Status: AC
  Administered 2021-06-09: 1 via RECTAL
  Filled 2021-06-09: qty 1

## 2021-06-09 MED ORDER — PANTOPRAZOLE SODIUM 40 MG PO TBEC
40.0000 mg | DELAYED_RELEASE_TABLET | Freq: Two times a day (BID) | ORAL | Status: DC
  Administered 2021-06-09 – 2021-06-10 (×2): 40 mg via ORAL
  Filled 2021-06-09 (×3): qty 1

## 2021-06-09 NOTE — Progress Notes (Signed)
Patient reports that he is still in pain. Patient reminded to call and report pain as he has PRN medications to manage and control his pain when appropriate. Patient slept for most of the night. Denies BM during shift, but has urinated multiple times with no issues. Informed patient he could eat and order his breakfast at 0630. Afebrile during shift BP 140-150 systolic. Denies nausea, vomiting, and diarrhea. Due to pain and patient reporting no improvement of pain, GI may be consulted to further evaluate and intervene as appropriate.

## 2021-06-09 NOTE — Progress Notes (Signed)
Progress Note: General Surgery Service   Chief Complaint/Subjective: Described events to me.  First had vertigo, then retched very hard and couldn't bring anything up, then abdominal pain started due to retching so hard  Objective: Vital signs in last 24 hours: Temp:  [98 F (36.7 C)-99.2 F (37.3 C)] 98.5 F (36.9 C) (05/21 0750) Pulse Rate:  [60-94] 61 (05/21 0750) Resp:  [13-36] 18 (05/21 0750) BP: (129-168)/(85-109) 154/104 (05/21 0750) SpO2:  [97 %-100 %] 100 % (05/21 0750) Weight:  [88.5 kg] 88.5 kg (05/20 0854) Last BM Date : 06/07/21 (Per Patient)  Intake/Output from previous day: 05/20 0701 - 05/21 0700 In: 4511.7 [P.O.:360; I.V.:1541.7; IV Piggyback:2610] Out: -  Intake/Output this shift: No intake/output data recorded.  Constitutional: NAD; conversant; no deformities Eyes: Moist conjunctiva; no lid lag; anicteric; PERRL Neck: Trachea midline; no thyromegaly Lungs: Normal respiratory effort; no tactile fremitus CV: RRR; no palpable thrills; no pitting edema GI: Abd mild epigastric tenderness; no palpable hepatosplenomegaly MSK: Normal range of motion of extremities; no clubbing/cyanosis Psychiatric: Appropriate affect; alert and oriented x3 Lymphatic: No palpable cervical or axillary lymphadenopathy  Lab Results: CBC  Recent Labs    06/08/21 0904  WBC 9.6  HGB 15.5  HCT 44.2  PLT 360   BMET Recent Labs    06/08/21 0904 06/09/21 0043  NA 139 138  K 3.2* 3.7  CL 106 106  CO2 17* 23  GLUCOSE 205* 110*  BUN 6 <5*  CREATININE 1.09 0.80  CALCIUM 9.3 8.7*   PT/INR No results for input(s): LABPROT, INR in the last 72 hours. ABG Recent Labs    06/08/21 1634  HCO3 21.5    Anti-infectives: Anti-infectives (From admission, onward)    None       Medications: Scheduled Meds:  acetaminophen  1,000 mg Oral Q6H   meclizine  12.5 mg Oral BID   pantoprazole  40 mg Oral Daily   polyethylene glycol  17 g Oral Daily   senna-docusate  1 tablet Oral  Daily   Continuous Infusions:  sodium chloride 125 mL/hr at 06/09/21 0618   PRN Meds:.ondansetron **OR** ondansetron (ZOFRAN) IV, oxyCODONE  Assessment/Plan: Mr. Arnall presents after an episode of vertigo.  He makes a point that the vertigo was the first and major problem, which caused retching and the retching caused abdominal pain.  He had lactic acidosis which is clearing.  He has a normal CT of the abdomen and improving abdominal pain.  He has no indication for surgical intervention.  Recommend working up the vertigo, possible seizure? That caused the presenting symptoms and possibly caused the lactic acidosis.  Surgery team will be available as needed.   LOS: 1 day     Quentin Ore, MD  [facility] Use [email or web address] to contact on call provider  Daily Billing: [id number] - Moderate MDM

## 2021-06-09 NOTE — H&P (View-Only) (Signed)
Consultation  Referring Provider:     Dr. Owens Shark Primary Care Physician:  [facility] Primary Gastroenterologist: Althia Forts       Reason for Consultation: Abdominal pain with nausea            HPI:   Kevin Callahan is a 41 y.o. male with a past medical history as listed below, who presented to the ER on 06/08/2021 with abdominal pain and nausea.    Today, patient is seen with all of his family at his bedside and he explains that yesterday he woke up and felt a little bit of dizziness so he tried to go back to sleep for another 30 minutes but when he woke up again he was still very dizzy and the room was spinning around him.  He tried to make his way to the bathroom and started with nausea and multiple episodes of vomiting.  At that time his wife called the EMS who gave him some medicine for nausea and then his abdomen started hurting, a 10/10 pain which seems to radiate through to his back.  He tells me that he was dizzy all the way up until this morning and now as far as his dizziness he is 90% better but has continued with this strong epigastric pain anytime that the pain meds seem to wear off.  Prior to all of this had intermittent heartburn and reflux for which he will take occasional over-the-counter antiacid.  He tells me this is worse when he eats a lot of spicy [place] food.    Patient's main concern today is to make sure that he has nothing going on in his head like a tumor or something worse.    Denies fever, chills, weight loss or change in bowel habits.     ER course: CTA abdomen was negative, initial labs with normal lipase and LFTs, lactic acid was elevated at 6.9, right upper quadrant ultrasound normal  Past Medical History:  Diagnosis Date   Hyperlipemia    Hypertension     Past Surgical History:  Procedure Laterality Date   NO PAST SURGERIES      History reviewed. No pertinent family history.   Social History   Tobacco Use    Smoking status: Former    Packs/day: 0.25    Types: Cigarettes   Smokeless tobacco: Never  Vaping Use   Vaping Use: Never used  Substance Use Topics   Alcohol use: Yes    Comment: occasioinally   Drug use: No    Prior to Admission medications   Medication Sig Start Date End Date Taking? Authorizing Provider  acetaminophen (TYLENOL) 500 MG tablet Take 500 mg by mouth daily as needed for fever (pain).   Yes [provider]  Ginger, Zingiber officinalis, (GINGER ROOT PO) Take 1 capsule by mouth once.   Yes [provider]  ibuprofen (ADVIL) 200 MG tablet Take 400 mg by mouth daily as needed for fever (pain).   Yes [provider]  Simethicone (GAS RELIEF PO) Take 1 tablet by mouth daily as needed (gas/bloating).   Yes [provider]  traZODone (DESYREL) 50 MG tablet Take 50 mg by mouth at bedtime as needed for sleep. 03/21/21  Yes [provider]  Vitamin D, Ergocalciferol, (DRISDOL) 1.25 MG (50000 UNIT) CAPS capsule Take 50,000 Units by mouth every Wednesday. Patient not taking: Reported on 06/08/2021 03/20/21   [provider]    Current Facility-Administered Medications  Medication Dose  Route Frequency Provider Last Rate Last Admin   0.9 %  sodium chloride infusion   Intravenous Continuous Alen Bleacher, MD 125 mL/hr at 06/09/21 1104 New Bag at 06/09/21 1104   acetaminophen (TYLENOL) tablet 1,000 mg  1,000 mg Oral Q6H Alen Bleacher, MD   1,000 mg at 06/09/21 1207   meclizine (ANTIVERT) tablet 12.5 mg  12.5 mg Oral BID PRN Martyn Malay, MD       ondansetron [facility]) tablet 4 mg  4 mg Oral Q6H PRN Alen Bleacher, MD       Or   ondansetron [facility]) injection 4 mg  4 mg Intravenous Q6H PRN Alen Bleacher, MD   4 mg at 06/08/21 1455   oxyCODONE (Oxy IR/ROXICODONE) immediate release tablet 2.5 mg  2.5 mg Oral Q6H PRN Alen Bleacher, MD   2.5 mg at 06/09/21 1208   pantoprazole (PROTONIX) EC tablet 40 mg  40 mg Oral Daily Alen Bleacher, MD    40 mg at 06/09/21 1105   polyethylene glycol (MIRALAX / GLYCOLAX) packet 17 g  17 g Oral BID Martyn Malay, MD   17 g at 06/09/21 1105   senna-docusate (Senokot-S) tablet 1 tablet  1 tablet Oral Daily Lattie Haw, MD   1 tablet at 06/09/21 1105    Allergies as of 06/08/2021   (No Known Allergies)   Review of Systems:    Constitutional: No weight loss, fever or chills Skin: No rash  Cardiovascular: No chest pain  Respiratory: No SOB  Gastrointestinal: See HPI and otherwise negative Genitourinary: No dysuria  Neurological: No headache or syncope Musculoskeletal: No new muscle or joint pain Hematologic: No bleeding  Psychiatric: No history of depression or anxiety    Physical Exam:  Vital signs in last 24 hours: Temp:  [98 F (36.7 C)-99.2 F (37.3 C)] 98.5 F (36.9 C) (05/21 0750) Pulse Rate:  [60-91] 61 (05/21 0750) Resp:  [13-27] 18 (05/21 0750) BP: (129-168)/(85-104) 154/104 (05/21 0750) SpO2:  [97 %-100 %] 100 % (05/21 0750) Last BM Date : 06/07/21 (Per Patient) General:   Pleasant [place] male appears to be in NAD, Well developed, Well nourished, alert and cooperative Head:  Normocephalic and atraumatic. Eyes:   PEERL, EOMI. No icterus. Conjunctiva pink. Ears:  Normal auditory acuity. Neck:  Supple Throat: Oral cavity and pharynx without inflammation, swelling or lesion. Teeth in good condition. Lungs: Respirations even and unlabored. Lungs clear to auscultation bilaterally.   No wheezes, crackles, or rhonchi.  Heart: Normal S1, S2. No MRG. Regular rate and rhythm. No peripheral edema, cyanosis or pallor.  Abdomen:  Soft, nondistended, mild epigastric ttp (just received pain meds). No rebound or guarding. Normal bowel sounds. No appreciable masses or hepatomegaly. Rectal:  Not performed.  Msk:  Symmetrical without gross deformities. Peripheral pulses intact.  Extremities:  Without edema, no deformity or joint abnormality. Normal ROM, normal sensation. Neurologic:   Alert and  oriented x4;  grossly normal neurologically.  Skin:   Dry and intact without significant lesions or rashes. Psychiatric: Demonstrates good judgement and reason without abnormal affect or behaviors.  LAB RESULTS: Recent Labs    06/08/21 0904  WBC 9.6  HGB 15.5  HCT 44.2  PLT 360   BMET Recent Labs    06/08/21 0904 06/09/21 0043  NA 139 138  K 3.2* 3.7  CL 106 106  CO2 17* 23  GLUCOSE 205* 110*  BUN 6 <5*  CREATININE 1.09 0.80  CALCIUM 9.3 8.7*   LFT Recent Labs  06/09/21 0043  PROT 6.1*  ALBUMIN 3.5  AST 15  ALT 17  ALKPHOS 53  BILITOT 0.8   STUDIES: DG Abdomen Acute W/Chest  Result Date: 06/08/2021 CLINICAL DATA:  Abdominal pain, nausea, vomiting EXAM: DG ABDOMEN ACUTE WITH 1 VIEW CHEST COMPARISON:  None Available. FINDINGS: Bowel gas pattern is nonspecific. Moderate amount of stool is seen in the colon. There is no fecal impaction in the rectosigmoid. No abnormal masses or calcifications are seen. Transverse diameter of heart is slightly increased. There are no signs of pulmonary edema or focal pulmonary consolidation. There is no pleural effusion or pneumothorax. IMPRESSION: Nonspecific bowel gas pattern. There are no focal pulmonary infiltrates. Electronically Signed   By: Elmer Picker M.D.   On: 06/08/2021 10:00   CT Angio Chest/Abd/Pel for Dissection W and/or Wo Contrast  Result Date: 06/08/2021 CLINICAL DATA:  Acute aortic syndrome suspected EXAM: CT ANGIOGRAPHY CHEST, ABDOMEN AND PELVIS TECHNIQUE: Non-contrast CT of the chest was initially obtained. Multidetector CT imaging through the chest, abdomen and pelvis was performed using the standard protocol during bolus administration of intravenous contrast. Multiplanar reconstructed images and MIPs were obtained and reviewed to evaluate the vascular anatomy. RADIATION DOSE REDUCTION: This exam was performed according to the departmental dose-optimization program which includes automated exposure  control, adjustment of the mA and/or kV according to patient size and/or use of iterative reconstruction technique. CONTRAST:  160mL OMNIPAQUE IOHEXOL 350 MG/ML SOLN COMPARISON:  None Available. FINDINGS: CTA CHEST FINDINGS Cardiovascular: There is no mural hematoma in thoracic aorta in the noncontrast images. There is no demonstrable intimal flap in the thoracic aorta. Major branches of thoracic aorta appear patent. 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 There are no intraluminal filling defects in the central pulmonary artery branches. Evaluation of small peripheral branches is limited in this examination tailored for evaluation of the systemic circulation. Mediastinum/Nodes: No significant lymphadenopathy seen. There is 8 mm low-density nodule in the left lobe of thyroid. Lungs/Pleura: There is small air-filled structure posterior to the trachea in the lower neck, possibly a diverticulum arising from the cervical esophagus. There is no evidence of any adjacent inflammation. There are no focal pulmonary infiltrates. There is no pleural effusion or pneumothorax. Musculoskeletal: Unremarkable. Review of the MIP images confirms the above findings. CTA ABDOMEN AND PELVIS FINDINGS VASCULAR Aorta: There is no evidence of dissection in the abdominal aorta. There is no focal aneurysmal dilation. Celiac: Normal. SMA: No significant stenosis. Renals: Unremarkable. IMA: Patent. Iliac arteries: Unremarkable. Veins: Unremarkable. Review of the MIP images confirms the above findings. NON-VASCULAR Hepatobiliary: No focal abnormality is seen. There is no dilation of bile ducts. Gallbladder is unremarkable. Pancreas: No focal abnormality is seen. Spleen: Unremarkable. Adrenals/Urinary Tract: Adrenals are unremarkable. There is no hydronephrosis. There are no renal or ureteral stones. Urinary bladder is unremarkable. Stomach/Bowel: Small hiatal hernia is seen. Small bowel loops are not dilated. Appendix is  unremarkable. There is no significant wall thickening in colon. Lymphatic: No significant lymphadenopathy seen. Reproductive: Unremarkable. Other: There is no ascites or pneumoperitoneum. Umbilical hernia containing fat is seen. Musculoskeletal: Unremarkable. Review of the MIP images confirms the above findings. IMPRESSION: There is no evidence of dissection in the thoracic and abdominal aorta. Major branches of thoracic and abdominal aorta are patent. There is no mediastinal or retroperitoneal hematoma. 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 This recommendation follows 2010 ACCF/AHA/AATS/ACR/ASA/SCA/SCAI/SIR/STS/SVM Guidelines for the Diagnosis and Management of  Patients with Thoracic Aortic Disease. Circulation. 2010; [id number]. Aortic aneurysm NOS (ICD10-I71.9) There is no evidence of central pulmonary artery embolism. There is no focal pulmonary consolidation. There is no evidence of intestinal obstruction or pneumoperitoneum. There is no hydronephrosis. 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 Small hiatal hernia. Other findings as described in the body of the report. Electronically Signed   By: Elmer Picker M.D.   On: 06/08/2021 10:18   US SCROTUM W/DOPPLER  Result Date: 06/08/2021 CLINICAL DATA:  Concern for testicular torsion Abdominal pain, nausea, emesis EXAM: SCROTAL ULTRASOUND DOPPLER ULTRASOUND OF THE TESTICLES TECHNIQUE: Complete ultrasound examination of the testicles, epididymis, and other scrotal structures was performed. Color and spectral Doppler ultrasound were also utilized to evaluate blood flow to the testicles. COMPARISON:  CT angiography chest, abdomen, and pelvis 06/08/2021 FINDINGS: Right testicle Measurements: 3.9 x 2.6 x 2.9 cm. No mass or microlithiasis visualized. Left testicle Measurements: 3.7 x 2.4 x 3.0 cm. 7 mm simple cyst of doubtful clinical significance. Right epididymis:   Normal in size and appearance. Left epididymis:  5 mm left epididymal cyst. Hydrocele:  None visualized. Varicocele:  None visualized. Pulsed Doppler interrogation of both testes demonstrates normal low resistance arterial and venous waveforms bilaterally. IMPRESSION: No significant sonographic abnormality of the scrotum. Electronically Signed   By: Miachel Roux M.D.   On: 06/08/2021 11:10   US Abdomen Limited RUQ (LIVER/GB)  Result Date: 06/08/2021 CLINICAL DATA:  Abdominal pain. EXAM: ULTRASOUND ABDOMEN LIMITED RIGHT UPPER QUADRANT COMPARISON:  None Available. FINDINGS: Gallbladder: No gallstones or wall thickening visualized. No sonographic Murphy sign noted by sonographer. Common bile duct: Diameter: 4 mm Liver: No focal lesion identified. Within normal limits in parenchymal echogenicity. Portal vein is patent on color Doppler imaging with normal direction of blood flow towards the liver. Other: None. IMPRESSION: Normal right upper quadrant ultrasound. Electronically Signed   By: Ronney Asters M.D.   On: 06/08/2021 20:45      Impression / Plan:   Impression: 1.  Abdominal pain with lactic acidosis: CTA negative, right upper quadrant ultrasound normal, labs normal other than elevated lactic acidosis, pain is likely from acute retching and vomiting in the setting of possible chronic mild gastritis given history of heartburn/reflux; discussed this likely represents acute esophagitis or possibly Mallory-Weiss tear with an element of chronic gastritis and possibly muscular strain from vomiting 2.  Hypokalemia 3.  Vertigo/dizziness: Primary care team is thinking positional vertigo, but patient is worried about a brain tumor, dizziness is 90% better today, this is really his primary symptom then followed by vomiting and then abdominal pain  Plan: 1.  Continue Pantoprazole 40 mg p.o., will increase to twice daily dosing to cover for any possible ulcer disease etc. 2.  We will proceed with EGD given  ongoing abdominal pain, this will be scheduled for tomorrow with Dr. Silverio Decamp.  Did discuss risks, benefits, limitations and alternatives and patient agrees to proceed. 3.  Could consider a CT of the brain given acute onset of dizziness and patient's concern over something being missed.  I did discuss positional vertigo with him in detail today and explained that since his symptoms went away so quickly it is likely nothing terrible, but primary team could consider. 4.  Patient will be on clear liquids today and n.p.o. at midnight 5.  Discussed with patient likely he will be able to be discharged tomorrow after time of endoscopy.  Thank you for your kind consultation, we will continue to follow.  Lavone Nian Corley Kohls  06/09/2021, 12:22 PM

## 2021-06-09 NOTE — Hospital Course (Addendum)
Kevin Callahan is a 41 y.o. male who was admitted to the [facility] at [facility] for Abdominal pain. Hospital course is outlined below by system.   Abdominal pain, lactic acidosis Patient presented with acute onset epigastric pain.  In the ED: Lactic acid 6.9, CBC, CMP within normal limits.  Dissection study was negative for aortic dissection.  Initially given elevated lactic acid there was a suspicion for ischemic colitis and surgery were consulted.  Surgery did not recommend any acute intervention.  Upper quadrant ultrasound negative.  Patient's pain improved slightly with Tylenol and oxycodone.  GI were consulted on day 2 of admission who recommended EGD.  Patient had EGD on 5/22 which was unremarkable. Continued patient on Protonix and recommend outpatient follow up with GI  Intermittent dizziness Patient had dizziness and felt as though the room was spinning around him.  This also led to multiple episodes of nausea and vomiting. Head CT showed no acute intracranial process and his EEG was normal.  He was treated with Zofran and meclizine in the hospital.  The cause of his dizziness is unclear but suspicious of BPPV. Recommend follow up with outpatient neurology.  Elevated blood pressures BP 1 50-160 systolic.  Could be due to acute pain or underlying hypertension. He was asymptomatic. Patient could benefit from long term BP management outpatient.   PCP recommendations: Follow-up with GI as an outpatient Elevated blood pressures in the hospital, consider starting on antihypertensive Refer to neurology outpatient for on going vertigo symptoms

## 2021-06-09 NOTE — Progress Notes (Signed)
Family Medicine Teaching Service Daily Progress Note Intern Pager: [phone]  Patient name: Kevin Callahan Medical record number: [id number] Date of birth: March 14, 1980 Age: 41 y.o. Gender: male  Primary Care Provider: [facility]:  Code Status: full   Pt Overview and Major Events to Date:  5/20: Admitted 5/21: GI consult   Assessment and Plan:  Epigastric pain unclear etiology, treating Pt reports epigastric pain has overall subsided overnight but it did get worse around 5am. He received 2.5mg  oxycodone which helped with the pain. Pt is requesting enema as he feels he needs to have a BM but feels constipated.  Last BM was 2 days ago. Overnight received Tylenol and 2 doses of oxycodone 2.5 mg Right upper quadrant ultrasound overnight was normal.This morning CMP is within normal limits except elevated glucose to 110.  Lactic acid overnight trended down 6.7> 5.9> 7.2> 4.8.  A1c 4.9. S/p 3.5 L fluid resuscitation plus maintenance fluids. Differentials are broad but likely GI related: H pylori infection, gastritis, duodenitis or PUD. Explained to pt and wife that we will get a GI consult today given on going sx. They were happy with this plan.  -GI consult a.m. given that pain has not improved -Regular diet  -Tylenol 1000 mg every 6 hourly  -Oxycodone 2.5 mg every 6 hourly as needed -Zofran every 6 hourly as needed -Advance diet as tolerated -Continue miralax -Continue senna -Enema x 1 -Trend lactic acid until flat  Vertigo, treating Symptoms improved with meclizine. Cranial nerves grossly in tact. Pt is requesting CT head for further investigation of vertigo. Explained that the vertigo is likely BPPV and does not require CT imaging for dx.. -Continue meclizine 12.5 mg twice daily -Follow up with PCP as oupatietn   Hyperlipidemia LDL 102 on lipid panel on admission -Diet and excise counseling -Monitor as an outpatient  FEN/GI: Regular diet,  Protonix PPx: None, early ambulation Dispo:Home in 2-3 days. Barriers include pending medical improvement.   Subjective:  Reports epigastric pain has overall subsided overnight but it did get worse around 5am. He received oxycodone which helped with the pain. I answered patient's and wife's questions at bedside. Pt is requesting enema as he feels he needs to have a BM but feels constipated.    Objective: Temp:  [98 F (36.7 C)-99.2 F (37.3 C)] 98.5 F (36.9 C) (05/21 0750) Pulse Rate:  [60-94] 61 (05/21 0750) Resp:  [13-30] 18 (05/21 0750) BP: (129-168)/(85-104) 154/104 (05/21 0750) SpO2:  [97 %-100 %] 100 % (05/21 0750)  Physical Exam: General: Alert, no acute distress Cardio: Normal S1 and S2, RRR, no r/m/g Pulm: CTAB, normal work of breathing Abdomen: Bowel sounds normal. Abdomen soft, mildly distended and non-tender.  Extremities: No peripheral edema.  Neuro: Cranial nerves grossly intact   Laboratory: Recent Labs  Lab 06/08/21 0904  WBC 9.6  HGB 15.5  HCT 44.2  PLT 360   Recent Labs  Lab 06/08/21 0904 06/09/21 0043  NA 139 138  K 3.2* 3.7  CL 106 106  CO2 17* 23  BUN 6 <5*  CREATININE 1.09 0.80  CALCIUM 9.3 8.7*  PROT 6.9 6.1*  BILITOT 0.4 0.8  ALKPHOS 66 53  ALT 20 17  AST 25 15  GLUCOSE 205* 110*      Imaging/Diagnostic Tests: DG Abdomen Acute W/Chest  Result Date: 06/08/2021 CLINICAL DATA:  Abdominal pain, nausea, vomiting EXAM: DG ABDOMEN ACUTE WITH 1 VIEW CHEST COMPARISON:  None Available. FINDINGS: Bowel gas pattern is nonspecific.  Moderate amount of stool is seen in the colon. There is no fecal impaction in the rectosigmoid. No abnormal masses or calcifications are seen. Transverse diameter of heart is slightly increased. There are no signs of pulmonary edema or focal pulmonary consolidation. There is no pleural effusion or pneumothorax. IMPRESSION: Nonspecific bowel gas pattern. There are no focal pulmonary infiltrates. Electronically Signed    By: Elmer Picker M.D.   On: 06/08/2021 10:00   CT Angio Chest/Abd/Pel for Dissection W and/or Wo Contrast  Result Date: 06/08/2021 CLINICAL DATA:  Acute aortic syndrome suspected EXAM: CT ANGIOGRAPHY CHEST, ABDOMEN AND PELVIS TECHNIQUE: Non-contrast CT of the chest was initially obtained. Multidetector CT imaging through the chest, abdomen and pelvis was performed using the standard protocol during bolus administration of intravenous contrast. Multiplanar reconstructed images and MIPs were obtained and reviewed to evaluate the vascular anatomy. RADIATION DOSE REDUCTION: This exam was performed according to the departmental dose-optimization program which includes automated exposure control, adjustment of the mA and/or kV according to patient size and/or use of iterative reconstruction technique. CONTRAST:  141mL OMNIPAQUE IOHEXOL 350 MG/ML SOLN COMPARISON:  None Available. FINDINGS: CTA CHEST FINDINGS Cardiovascular: There is no mural hematoma in thoracic aorta in the noncontrast images. There is no demonstrable intimal flap in the thoracic aorta. Major branches of thoracic aorta appear patent. 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 There are no intraluminal filling defects in the central pulmonary artery branches. Evaluation of small peripheral branches is limited in this examination tailored for evaluation of the systemic circulation. Mediastinum/Nodes: No significant lymphadenopathy seen. There is 8 mm low-density nodule in the left lobe of thyroid. Lungs/Pleura: There is small air-filled structure posterior to the trachea in the lower neck, possibly a diverticulum arising from the cervical esophagus. There is no evidence of any adjacent inflammation. There are no focal pulmonary infiltrates. There is no pleural effusion or pneumothorax. Musculoskeletal: Unremarkable. Review of the MIP images confirms the above findings. CTA ABDOMEN AND PELVIS FINDINGS VASCULAR Aorta: There is  no evidence of dissection in the abdominal aorta. There is no focal aneurysmal dilation. Celiac: Normal. SMA: No significant stenosis. Renals: Unremarkable. IMA: Patent. Iliac arteries: Unremarkable. Veins: Unremarkable. Review of the MIP images confirms the above findings. NON-VASCULAR Hepatobiliary: No focal abnormality is seen. There is no dilation of bile ducts. Gallbladder is unremarkable. Pancreas: No focal abnormality is seen. Spleen: Unremarkable. Adrenals/Urinary Tract: Adrenals are unremarkable. There is no hydronephrosis. There are no renal or ureteral stones. Urinary bladder is unremarkable. Stomach/Bowel: Small hiatal hernia is seen. Small bowel loops are not dilated. Appendix is unremarkable. There is no significant wall thickening in colon. Lymphatic: No significant lymphadenopathy seen. Reproductive: Unremarkable. Other: There is no ascites or pneumoperitoneum. Umbilical hernia containing fat is seen. Musculoskeletal: Unremarkable. Review of the MIP images confirms the above findings. IMPRESSION: There is no evidence of dissection in the thoracic and abdominal aorta. Major branches of thoracic and abdominal aorta are patent. There is no mediastinal or retroperitoneal hematoma. 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 This recommendation follows 2010 ACCF/AHA/AATS/ACR/ASA/SCA/SCAI/SIR/STS/SVM Guidelines for the Diagnosis and Management of Patients with Thoracic Aortic Disease. Circulation. 2010; [id number]. Aortic aneurysm NOS (ICD10-I71.9) There is no evidence of central pulmonary artery embolism. There is no focal pulmonary consolidation. There is no evidence of intestinal obstruction or pneumoperitoneum. There is no hydronephrosis. 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 Small hiatal hernia. Other findings as described  in the body of the report. Electronically Signed   By: Elmer Picker M.D.   On:  06/08/2021 10:18   US SCROTUM W/DOPPLER  Result Date: 06/08/2021 CLINICAL DATA:  Concern for testicular torsion Abdominal pain, nausea, emesis EXAM: SCROTAL ULTRASOUND DOPPLER ULTRASOUND OF THE TESTICLES TECHNIQUE: Complete ultrasound examination of the testicles, epididymis, and other scrotal structures was performed. Color and spectral Doppler ultrasound were also utilized to evaluate blood flow to the testicles. COMPARISON:  CT angiography chest, abdomen, and pelvis 06/08/2021 FINDINGS: Right testicle Measurements: 3.9 x 2.6 x 2.9 cm. No mass or microlithiasis visualized. Left testicle Measurements: 3.7 x 2.4 x 3.0 cm. 7 mm simple cyst of doubtful clinical significance. Right epididymis:  Normal in size and appearance. Left epididymis:  5 mm left epididymal cyst. Hydrocele:  None visualized. Varicocele:  None visualized. Pulsed Doppler interrogation of both testes demonstrates normal low resistance arterial and venous waveforms bilaterally. IMPRESSION: No significant sonographic abnormality of the scrotum. Electronically Signed   By: Miachel Roux M.D.   On: 06/08/2021 11:10   US Abdomen Limited RUQ (LIVER/GB)  Result Date: 06/08/2021 CLINICAL DATA:  Abdominal pain. EXAM: ULTRASOUND ABDOMEN LIMITED RIGHT UPPER QUADRANT COMPARISON:  None Available. FINDINGS: Gallbladder: No gallstones or wall thickening visualized. No sonographic Murphy sign noted by sonographer. Common bile duct: Diameter: 4 mm Liver: No focal lesion identified. Within normal limits in parenchymal echogenicity. Portal vein is patent on color Doppler imaging with normal direction of blood flow towards the liver. Other: None. IMPRESSION: Normal right upper quadrant ultrasound. Electronically Signed   By: Ronney Asters M.D.   On: 06/08/2021 20:45     Lattie Haw, MD 06/09/2021, 9:27 AM PGY-3, [place] Intern pager: [phone], text pages welcome

## 2021-06-09 NOTE — Progress Notes (Shared)
Family Medicine Teaching Service Daily Progress Note Intern Pager: [phone]  Patient name: Amaru Burroughs Medical record number: [id number] Date of birth: 1980-03-16 Age: 41 y.o. Gender: male  Primary Care Provider: [facility]: Gen surg, GI Code Status: Full  Pt Overview and Major Events to Date:  5/19- Admitted  Assessment and Plan:  Mr. Schertzer is a 41 year old male with no extensive medical history admitted for worsening abdominal pain of unclear causes.  Epigastric Pain of unclear etiology Patient pain has improved since admission byt still have intermittent breakthrough pain.Surgery was consulted who report no surgicalintervention needed at this time but can available as needed. Gi has been culted who plan on EGD today for concerns of malory-weiss considering pt had an emesis short before the start of his abdominal pain. Trending lactic acid this morning continue to improve to*** -GI consulted, appreciate recs -Regular diet -Tylenol 1000 mg Q6H -Oxycodone 2.5mg  Q6H PRN  -Zofran Q6H PRN -Continue Miralax -Continue Senna -Monitor lactic acid  Vertigo Symptom improved. Low suspicion for seizure.  -Continue Meclizine 12.25mh BID -Follow up with PCP outpatient   Hyperlipidemia LDL 102 on admission -canselled on diet and exercise -    FEN/GI: *** PPx: *** Dispo:{FPTSDISPOLIST:25765} {FPTSDISPOTIME:25766}. Barriers include ***.   Subjective:  ***  Objective: Temp:  [98.5 F (36.9 C)-99.8 F (37.7 C)] 99.8 F (37.7 C) (05/21 1541) Pulse Rate:  [60-65] 65 (05/21 1541) Resp:  [16-18] 17 (05/21 1541) BP: (146-163)/(92-104) 163/99 (05/21 1541) SpO2:  [98 %-100 %] 100 % (05/21 1541) Physical Exam: General: *** Cardiovascular: *** Respiratory: *** Abdomen: *** Extremities: ***  Laboratory: Recent Labs  Lab 06/08/21 0904  WBC 9.6  HGB 15.5  HCT 44.2  PLT 360   Recent Labs  Lab 06/08/21 0904 06/09/21 0043   NA 139 138  K 3.2* 3.7  CL 106 106  CO2 17* 23  BUN 6 <5*  CREATININE 1.09 0.80  CALCIUM 9.3 8.7*  PROT 6.9 6.1*  BILITOT 0.4 0.8  ALKPHOS 66 53  ALT 20 17  AST 25 15  GLUCOSE 205* 110*    ***  Imaging/Diagnostic Tests: Jerre Simon, MD 06/09/2021, 9:17 PM PGY-***, [facility] FPTS Intern pager: [phone], text pages welcome

## 2021-06-09 NOTE — Consult Note (Signed)
   Consultation  Referring Provider:     Dr. Brown Primary Care Physician:  [facility] Primary Gastroenterologist: Unassigned       Reason for Consultation: Abdominal pain with nausea            HPI:   Kevin Callahan is a 41 y.o. male with a past medical history as listed below, who presented to the ER on 06/08/2021 with abdominal pain and nausea.    Today, patient is seen with all of his family at his bedside and he explains that yesterday he woke up and felt a little bit of dizziness so he tried to go back to sleep for another 30 minutes but when he woke up again he was still very dizzy and the room was spinning around him.  He tried to make his way to the bathroom and started with nausea and multiple episodes of vomiting.  At that time his wife called the EMS who gave him some medicine for nausea and then his abdomen started hurting, a 10/10 pain which seems to radiate through to his back.  He tells me that he was dizzy all the way up until this morning and now as far as his dizziness he is 90% better but has continued with this strong epigastric pain anytime that the pain meds seem to wear off.  Prior to all of this had intermittent heartburn and reflux for which he will take occasional over-the-counter antiacid.  He tells me this is worse when he eats a lot of spicy Indian food.    Patient's main concern today is to make sure that he has nothing going on in his head like a tumor or something worse.    Denies fever, chills, weight loss or change in bowel habits.     ER course: CTA abdomen was negative, initial labs with normal lipase and LFTs, lactic acid was elevated at 6.9, right upper quadrant ultrasound normal  Past Medical History:  Diagnosis Date   Hyperlipemia    Hypertension     Past Surgical History:  Procedure Laterality Date   NO PAST SURGERIES      History reviewed. No pertinent family history.   Social History   Tobacco Use    Smoking status: Former    Packs/day: 0.25    Types: Cigarettes   Smokeless tobacco: Never  Vaping Use   Vaping Use: Never used  Substance Use Topics   Alcohol use: Yes    Comment: occasioinally   Drug use: No    Prior to Admission medications   Medication Sig Start Date End Date Taking? Authorizing Provider  acetaminophen (TYLENOL) 500 MG tablet Take 500 mg by mouth daily as needed for fever (pain).   Yes [provider]  Ginger, Zingiber officinalis, (GINGER ROOT PO) Take 1 capsule by mouth once.   Yes [provider]  ibuprofen (ADVIL) 200 MG tablet Take 400 mg by mouth daily as needed for fever (pain).   Yes [provider]  Simethicone (GAS RELIEF PO) Take 1 tablet by mouth daily as needed (gas/bloating).   Yes [provider]  traZODone (DESYREL) 50 MG tablet Take 50 mg by mouth at bedtime as needed for sleep. 03/21/21  Yes [provider]  Vitamin D, Ergocalciferol, (DRISDOL) 1.25 MG (50000 UNIT) CAPS capsule Take 50,000 Units by mouth every Wednesday. Patient not taking: Reported on 06/08/2021 03/20/21   [provider]    Current Facility-Administered Medications  Medication Dose   Route Frequency Provider Last Rate Last Admin   0.9 %  sodium chloride infusion   Intravenous Continuous Norbert, John, MD 125 mL/hr at 06/09/21 1104 New Bag at 06/09/21 1104   acetaminophen (TYLENOL) tablet 1,000 mg  1,000 mg Oral Q6H Norbert, John, MD   1,000 mg at 06/09/21 1207   meclizine (ANTIVERT) tablet 12.5 mg  12.5 mg Oral BID PRN Brown, Carina M, MD       ondansetron (ZOFRAN) tablet 4 mg  4 mg Oral Q6H PRN Norbert, John, MD       Or   ondansetron (ZOFRAN) injection 4 mg  4 mg Intravenous Q6H PRN Norbert, John, MD   4 mg at 06/08/21 1455   oxyCODONE (Oxy IR/ROXICODONE) immediate release tablet 2.5 mg  2.5 mg Oral Q6H PRN Norbert, John, MD   2.5 mg at 06/09/21 1208   pantoprazole (PROTONIX) EC tablet 40 mg  40 mg Oral Daily Norbert, John, MD    40 mg at 06/09/21 1105   polyethylene glycol (MIRALAX / GLYCOLAX) packet 17 g  17 g Oral BID Brown, Carina M, MD   17 g at 06/09/21 1105   senna-docusate (Senokot-S) tablet 1 tablet  1 tablet Oral Daily Patel, Poonam, MD   1 tablet at 06/09/21 1105    Allergies as of 06/08/2021   (No Known Allergies)   Review of Systems:    Constitutional: No weight loss, fever or chills Skin: No rash  Cardiovascular: No chest pain  Respiratory: No SOB  Gastrointestinal: See HPI and otherwise negative Genitourinary: No dysuria  Neurological: No headache or syncope Musculoskeletal: No new muscle or joint pain Hematologic: No bleeding  Psychiatric: No history of depression or anxiety    Physical Exam:  Vital signs in last 24 hours: Temp:  [98 F (36.7 C)-99.2 F (37.3 C)] 98.5 F (36.9 C) (05/21 0750) Pulse Rate:  [60-91] 61 (05/21 0750) Resp:  [13-27] 18 (05/21 0750) BP: (129-168)/(85-104) 154/104 (05/21 0750) SpO2:  [97 %-100 %] 100 % (05/21 0750) Last BM Date : 06/07/21 (Per Patient) General:   Pleasant Indian male appears to be in NAD, Well developed, Well nourished, alert and cooperative Head:  Normocephalic and atraumatic. Eyes:   PEERL, EOMI. No icterus. Conjunctiva pink. Ears:  Normal auditory acuity. Neck:  Supple Throat: Oral cavity and pharynx without inflammation, swelling or lesion. Teeth in good condition. Lungs: Respirations even and unlabored. Lungs clear to auscultation bilaterally.   No wheezes, crackles, or rhonchi.  Heart: Normal S1, S2. No MRG. Regular rate and rhythm. No peripheral edema, cyanosis or pallor.  Abdomen:  Soft, nondistended, mild epigastric ttp (just received pain meds). No rebound or guarding. Normal bowel sounds. No appreciable masses or hepatomegaly. Rectal:  Not performed.  Msk:  Symmetrical without gross deformities. Peripheral pulses intact.  Extremities:  Without edema, no deformity or joint abnormality. Normal ROM, normal sensation. Neurologic:   Alert and  oriented x4;  grossly normal neurologically.  Skin:   Dry and intact without significant lesions or rashes. Psychiatric: Demonstrates good judgement and reason without abnormal affect or behaviors.  LAB RESULTS: Recent Labs    06/08/21 0904  WBC 9.6  HGB 15.5  HCT 44.2  PLT 360   BMET Recent Labs    06/08/21 0904 06/09/21 0043  NA 139 138  K 3.2* 3.7  CL 106 106  CO2 17* 23  GLUCOSE 205* 110*  BUN 6 <5*  CREATININE 1.09 0.80  CALCIUM 9.3 8.7*   LFT Recent Labs      06/09/21 0043  PROT 6.1*  ALBUMIN 3.5  AST 15  ALT 17  ALKPHOS 53  BILITOT 0.8   STUDIES: DG Abdomen Acute W/Chest  Result Date: 06/08/2021 CLINICAL DATA:  Abdominal pain, nausea, vomiting EXAM: DG ABDOMEN ACUTE WITH 1 VIEW CHEST COMPARISON:  None Available. FINDINGS: Bowel gas pattern is nonspecific. Moderate amount of stool is seen in the colon. There is no fecal impaction in the rectosigmoid. No abnormal masses or calcifications are seen. Transverse diameter of heart is slightly increased. There are no signs of pulmonary edema or focal pulmonary consolidation. There is no pleural effusion or pneumothorax. IMPRESSION: Nonspecific bowel gas pattern. There are no focal pulmonary infiltrates. Electronically Signed   By: Palani  Rathinasamy M.D.   On: 06/08/2021 10:00   CT Angio Chest/Abd/Pel for Dissection W and/or Wo Contrast  Result Date: 06/08/2021 CLINICAL DATA:  Acute aortic syndrome suspected EXAM: CT ANGIOGRAPHY CHEST, ABDOMEN AND PELVIS TECHNIQUE: Non-contrast CT of the chest was initially obtained. Multidetector CT imaging through the chest, abdomen and pelvis was performed using the standard protocol during bolus administration of intravenous contrast. Multiplanar reconstructed images and MIPs were obtained and reviewed to evaluate the vascular anatomy. RADIATION DOSE REDUCTION: This exam was performed according to the departmental dose-optimization program which includes automated exposure  control, adjustment of the mA and/or kV according to patient size and/or use of iterative reconstruction technique. CONTRAST:  100mL OMNIPAQUE IOHEXOL 350 MG/ML SOLN COMPARISON:  None Available. FINDINGS: CTA CHEST FINDINGS Cardiovascular: There is no mural hematoma in thoracic aorta in the noncontrast images. There is no demonstrable intimal flap in the thoracic aorta. Major branches of thoracic aorta appear patent. 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 There are no intraluminal filling defects in the central pulmonary artery branches. Evaluation of small peripheral branches is limited in this examination tailored for evaluation of the systemic circulation. Mediastinum/Nodes: No significant lymphadenopathy seen. There is 8 mm low-density nodule in the left lobe of thyroid. Lungs/Pleura: There is small air-filled structure posterior to the trachea in the lower neck, possibly a diverticulum arising from the cervical esophagus. There is no evidence of any adjacent inflammation. There are no focal pulmonary infiltrates. There is no pleural effusion or pneumothorax. Musculoskeletal: Unremarkable. Review of the MIP images confirms the above findings. CTA ABDOMEN AND PELVIS FINDINGS VASCULAR Aorta: There is no evidence of dissection in the abdominal aorta. There is no focal aneurysmal dilation. Celiac: Normal. SMA: No significant stenosis. Renals: Unremarkable. IMA: Patent. Iliac arteries: Unremarkable. Veins: Unremarkable. Review of the MIP images confirms the above findings. NON-VASCULAR Hepatobiliary: No focal abnormality is seen. There is no dilation of bile ducts. Gallbladder is unremarkable. Pancreas: No focal abnormality is seen. Spleen: Unremarkable. Adrenals/Urinary Tract: Adrenals are unremarkable. There is no hydronephrosis. There are no renal or ureteral stones. Urinary bladder is unremarkable. Stomach/Bowel: Small hiatal hernia is seen. Small bowel loops are not dilated. Appendix is  unremarkable. There is no significant wall thickening in colon. Lymphatic: No significant lymphadenopathy seen. Reproductive: Unremarkable. Other: There is no ascites or pneumoperitoneum. Umbilical hernia containing fat is seen. Musculoskeletal: Unremarkable. Review of the MIP images confirms the above findings. IMPRESSION: There is no evidence of dissection in the thoracic and abdominal aorta. Major branches of thoracic and abdominal aorta are patent. There is no mediastinal or retroperitoneal hematoma. 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 This recommendation follows 2010 ACCF/AHA/AATS/ACR/ASA/SCA/SCAI/SIR/STS/SVM Guidelines for the Diagnosis and Management of   Patients with Thoracic Aortic Disease. Circulation. 2010; 121: E266-e369. Aortic aneurysm NOS (ICD10-I71.9) There is no evidence of central pulmonary artery embolism. There is no focal pulmonary consolidation. There is no evidence of intestinal obstruction or pneumoperitoneum. There is no hydronephrosis. 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 Small hiatal hernia. Other findings as described in the body of the report. Electronically Signed   By: Palani  Rathinasamy M.D.   On: 06/08/2021 10:18   US SCROTUM W/DOPPLER  Result Date: 06/08/2021 CLINICAL DATA:  Concern for testicular torsion Abdominal pain, nausea, emesis EXAM: SCROTAL ULTRASOUND DOPPLER ULTRASOUND OF THE TESTICLES TECHNIQUE: Complete ultrasound examination of the testicles, epididymis, and other scrotal structures was performed. Color and spectral Doppler ultrasound were also utilized to evaluate blood flow to the testicles. COMPARISON:  CT angiography chest, abdomen, and pelvis 06/08/2021 FINDINGS: Right testicle Measurements: 3.9 x 2.6 x 2.9 cm. No mass or microlithiasis visualized. Left testicle Measurements: 3.7 x 2.4 x 3.0 cm. 7 mm simple cyst of doubtful clinical significance. Right epididymis:   Normal in size and appearance. Left epididymis:  5 mm left epididymal cyst. Hydrocele:  None visualized. Varicocele:  None visualized. Pulsed Doppler interrogation of both testes demonstrates normal low resistance arterial and venous waveforms bilaterally. IMPRESSION: No significant sonographic abnormality of the scrotum. Electronically Signed   By: Farhaan  Mir M.D.   On: 06/08/2021 11:10   US Abdomen Limited RUQ (LIVER/GB)  Result Date: 06/08/2021 CLINICAL DATA:  Abdominal pain. EXAM: ULTRASOUND ABDOMEN LIMITED RIGHT UPPER QUADRANT COMPARISON:  None Available. FINDINGS: Gallbladder: No gallstones or wall thickening visualized. No sonographic Murphy sign noted by sonographer. Common bile duct: Diameter: 4 mm Liver: No focal lesion identified. Within normal limits in parenchymal echogenicity. Portal vein is patent on color Doppler imaging with normal direction of blood flow towards the liver. Other: None. IMPRESSION: Normal right upper quadrant ultrasound. Electronically Signed   By: Amy  Guttmann M.D.   On: 06/08/2021 20:45      Impression / Plan:   Impression: 1.  Abdominal pain with lactic acidosis: CTA negative, right upper quadrant ultrasound normal, labs normal other than elevated lactic acidosis, pain is likely from acute retching and vomiting in the setting of possible chronic mild gastritis given history of heartburn/reflux; discussed this likely represents acute esophagitis or possibly Mallory-Weiss tear with an element of chronic gastritis and possibly muscular strain from vomiting 2.  Hypokalemia 3.  Vertigo/dizziness: Primary care team is thinking positional vertigo, but patient is worried about a brain tumor, dizziness is 90% better today, this is really his primary symptom then followed by vomiting and then abdominal pain  Plan: 1.  Continue Pantoprazole 40 mg p.o., will increase to twice daily dosing to cover for any possible ulcer disease etc. 2.  We will proceed with EGD given  ongoing abdominal pain, this will be scheduled for tomorrow with Dr. Nandigam.  Did discuss risks, benefits, limitations and alternatives and patient agrees to proceed. 3.  Could consider a CT of the brain given acute onset of dizziness and patient's concern over something being missed.  I did discuss positional vertigo with him in detail today and explained that since his symptoms went away so quickly it is likely nothing terrible, but primary team could consider. 4.  Patient will be on clear liquids today and n.p.o. at midnight 5.  Discussed with patient likely he will be able to be discharged tomorrow after time of endoscopy.  Thank you for your kind consultation, we will continue to follow.    Broadus Costilla Lynne Sadrac Zeoli  06/09/2021, 12:22 PM    

## 2021-06-10 ENCOUNTER — Encounter (HOSPITAL_COMMUNITY)
Admission: EM | Disposition: A | Discharge status: Home / Self Care | Payer: Self-pay | Source: Home / Self Care | Attending: Emergency Medicine

## 2021-06-10 ENCOUNTER — Observation Stay (HOSPITAL_COMMUNITY): Payer: 59

## 2021-06-10 ENCOUNTER — Other Ambulatory Visit (HOSPITAL_COMMUNITY): Payer: Self-pay

## 2021-06-10 ENCOUNTER — Observation Stay (HOSPITAL_COMMUNITY): Payer: 59 | Admitting: Anesthesiology

## 2021-06-10 ENCOUNTER — Telehealth: Payer: Self-pay

## 2021-06-10 ENCOUNTER — Encounter (HOSPITAL_COMMUNITY): Payer: Self-pay | Admitting: Family Medicine

## 2021-06-10 ENCOUNTER — Observation Stay (HOSPITAL_BASED_OUTPATIENT_CLINIC_OR_DEPARTMENT_OTHER): Payer: 59 | Admitting: Anesthesiology

## 2021-06-10 DIAGNOSIS — R1010 Upper abdominal pain, unspecified: Secondary | ICD-10-CM | POA: Diagnosis not present

## 2021-06-10 DIAGNOSIS — R1084 Generalized abdominal pain: Secondary | ICD-10-CM | POA: Diagnosis not present

## 2021-06-10 DIAGNOSIS — R42 Dizziness and giddiness: Secondary | ICD-10-CM

## 2021-06-10 DIAGNOSIS — E872 Acidosis, unspecified: Secondary | ICD-10-CM | POA: Diagnosis not present

## 2021-06-10 DIAGNOSIS — R1013 Epigastric pain: Secondary | ICD-10-CM | POA: Diagnosis not present

## 2021-06-10 DIAGNOSIS — I10 Essential (primary) hypertension: Secondary | ICD-10-CM | POA: Diagnosis not present

## 2021-06-10 HISTORY — PX: ESOPHAGOGASTRODUODENOSCOPY (EGD) WITH PROPOFOL: SHX5813

## 2021-06-10 LAB — BASIC METABOLIC PANEL
Anion gap: 8 (ref 5–15)
BUN: <5 mg/dL — ABNORMAL LOW (ref 6–20)
CO2: 23 mmol/L (ref 22–32)
Calcium: 8.9 mg/dL (ref 8.9–10.3)
Chloride: 107 mmol/L (ref 98–111)
Creatinine, Ser: 0.85 mg/dL (ref 0.61–1.24)
GFR, Estimated: >60 mL/min (ref >60)
Glucose, Bld: 100 mg/dL — ABNORMAL HIGH (ref 70–99)
Potassium: 3.5 mmol/L (ref 3.5–5.1)
Sodium: 138 mmol/L (ref 135–145)

## 2021-06-10 LAB — CBC
HCT: 40.4 % (ref 39.0–52.0)
Hemoglobin: 13.8 g/dL (ref 13.0–17.0)
MCH: 28.5 pg (ref 26.0–34.0)
MCHC: 34.2 g/dL (ref 30.0–36.0)
MCV: 83.3 fL (ref 80.0–100.0)
Platelets: 278 10*3/uL (ref 150–400)
RBC: 4.85 MIL/uL (ref 4.22–5.81)
RDW: 12.7 % (ref 11.5–15.5)
WBC: 10.5 10*3/uL (ref 4.0–10.5)
nRBC: 0 % (ref 0.0–0.2)

## 2021-06-10 SURGERY — ESOPHAGOGASTRODUODENOSCOPY (EGD) WITH PROPOFOL
Anesthesia: Monitor Anesthesia Care

## 2021-06-10 MED ORDER — ONDANSETRON HCL 4 MG PO TABS
4.0000 mg | ORAL_TABLET | Freq: Four times a day (QID) | ORAL | 0 refills | Status: AC | PRN
  Filled 2021-06-10: qty 9, 3d supply, fill #0

## 2021-06-10 MED ORDER — MECLIZINE HCL 12.5 MG PO TABS
12.5000 mg | ORAL_TABLET | Freq: Two times a day (BID) | ORAL | 0 refills | Status: AC | PRN
  Filled 2021-06-10: qty 10, 5d supply, fill #0

## 2021-06-10 MED ORDER — LACTATED RINGERS IV SOLN: INTRAVENOUS | Status: DC | PRN

## 2021-06-10 MED ORDER — LIDOCAINE 2% (20 MG/ML) 5 ML SYRINGE
INTRAMUSCULAR | Status: DC | PRN
  Administered 2021-06-10: 100 mg via INTRAVENOUS

## 2021-06-10 MED ORDER — PROPOFOL 10 MG/ML IV BOLUS
INTRAVENOUS | Status: DC | PRN
  Administered 2021-06-10: 30 mg via INTRAVENOUS

## 2021-06-10 MED ORDER — PROPOFOL 500 MG/50ML IV EMUL
INTRAVENOUS | Status: DC | PRN
Start: 2021-06-10 — End: 2021-06-10
  Administered 2021-06-10: 150 ug/kg/min via INTRAVENOUS

## 2021-06-10 MED ORDER — MUSCLE RUB 10-15 % EX CREA
TOPICAL_CREAM | CUTANEOUS | Status: DC | PRN
  Filled 2021-06-10: qty 85

## 2021-06-10 MED ORDER — PANTOPRAZOLE SODIUM 40 MG PO TBEC
40.0000 mg | DELAYED_RELEASE_TABLET | Freq: Every day | ORAL | 0 refills | Status: AC
  Filled 2021-06-10: qty 30, 30d supply, fill #0

## 2021-06-10 SURGICAL SUPPLY — 15 items

## 2021-06-10 NOTE — Anesthesia Postprocedure Evaluation (Signed)
Anesthesia Post Note  Patient: Kevin Callahan  Procedure(s) Performed: ESOPHAGOGASTRODUODENOSCOPY (EGD) WITH PROPOFOL     Patient location during evaluation: PACU Anesthesia Type: MAC Level of consciousness: awake and alert Pain management: pain level controlled Vital Signs Assessment: post-procedure vital signs reviewed and stable Respiratory status: spontaneous breathing and respiratory function stable Cardiovascular status: stable Postop Assessment: no apparent nausea or vomiting Anesthetic complications: no   No notable events documented.  Last Vitals:  Vitals:   06/10/21 0920 06/10/21 0935  BP: 123/81 (!) 139/96  Pulse: 69 63  Resp: 19 20  Temp: (!) 36.2 C 36.4 C  SpO2: 98% 99%    Last Pain:  Vitals:   06/10/21 0935  TempSrc:   PainSc: 0-No pain                 Merlinda Frederick

## 2021-06-10 NOTE — Procedures (Signed)
Patient Name: Kevin Callahan  MRN: [id number]  Epilepsy Attending: Charlsie Quest  Referring Physician/Provider: Levin Erp, MD Date: 06/10/2021 Duration: 22.02 mins  Patient history: 41yo M with intermittent dizziness. EEG to evaluate for seizure  Level of alertness: Awake, asleep  AEDs during EEG study: None  Technical aspects: This EEG study was done with scalp electrodes positioned according to the 10-20 International system of electrode placement. Electrical activity was acquired at a sampling rate of 500Hz  and reviewed with a high frequency filter of 70Hz  and a low frequency filter of 1Hz . EEG data were recorded continuously and digitally stored.   Description: The posterior dominant rhythm consists of 9 Hz activity of moderate voltage (25-35 uV) seen predominantly in posterior head regions, symmetric and reactive to eye opening and eye closing. Sleep was characterized by vertex waves, sleep spindles (12 to 14 Hz), maximal frontocentral region.  Hyperventilation and photic stimulation were not performed.     IMPRESSION: This study is within normal limits. No seizures or epileptiform discharges were seen throughout the recording.  [name] 

## 2021-06-10 NOTE — Progress Notes (Signed)
Mobility Specialist Progress Note   06/10/21 1623  Mobility  Activity Ambulated independently in hallway  Level of Assistance Independent  Assistive Device None  Distance Ambulated (ft) 310 ft  Activity Response Tolerated well  $Mobility charge 1 Mobility   Received pt in chair having slight c/o muscle spasm in back but tolerable and agreeable to mobility. Asymptomatic throughout ambulation, returned back to chair eager for d/c home.  Frederico Hamman Mobility Specialist Phone Number [phone]

## 2021-06-10 NOTE — Interval H&P Note (Signed)
History and Physical Interval Note:  06/10/2021 8:11 AM  Kevin Callahan  has presented today for surgery, with the diagnosis of Epigastric pain.  The various methods of treatment have been discussed with the patient and family. After consideration of risks, benefits and other options for treatment, the patient has consented to  Procedure(s): ESOPHAGOGASTRODUODENOSCOPY (EGD) WITH PROPOFOL (N/A) as a surgical intervention.  The patient's history has been reviewed, patient examined, no change in status, stable for surgery.  I have reviewed the patient's chart and labs.  Questions were answered to the patient's satisfaction.     Tammey Deeg

## 2021-06-10 NOTE — Progress Notes (Signed)
Patient unavailable for EEG.  Going for EGD. Per RN, check in about 2 hours for availability.

## 2021-06-10 NOTE — Progress Notes (Signed)
FPTS Brief Progress Note  S:Patient initially sleeping then awakens to inform that he is no longer dizzy but that he is experiencing some back pain mildly relieved by heating pad. He requests Bengay or other topical, which he is informed has been ordered and should be coming soon. He denies further concerns or complaints.    O: BP (!) 137/91 (BP Location: Left Arm)   Pulse 72   Temp 98.6 F (37 C) (Oral)   Resp 18   Ht 5\' 7"  (1.702 m)   Wt 88.5 kg   SpO2 99%   BMI 30.54 kg/m   General: Uncomfortable appearing male, rubbing on back around L scapula, where heating pad is in place. Resp: Normal work of breathing on RA  A/P: Epigastric pain of unclear etiology Patient does not report abdominal pain tonight but appears generally uncomfortable and reports back pain (chronic, prev relieved by Bengay and gabapentin). MR Lactic acid 2.1. GI on board to evaluate for PUD or Mallory-Weiss tear -NPO since MN for EGD 5/22. -Continue pain control with Tylenol 1g q6h and Oxycodone 2.5 mg q6h PRN breakthrough pain. -Muscle Rub cream available PRN -Zofran q6h PRN nausea.  -Cont. Pantoprazole 40 mg BID  Vertigo CTH negative for source of vertigo. Patient denies current sx. Dizziness previously induced by Gabapentin, but patient no longer taking. Ddx: BPPV vs. Isolated transient vertigo vs. Vestibular neuritis vs. Foodborne illness vs. GERD (less common sx), less likely medication induced.  -Cont. Meclizine 12.5 mg BID PRN  - Orders reviewed. Labs for AM ordered, which were adjusted as needed.   6/22, MD 06/10/2021, 1:12 AM PGY-1, [facility] Night Resident  Please page [phone] with questions.

## 2021-06-10 NOTE — Telephone Encounter (Signed)
Patient has been scheduled for hospital f/u with Dr. Leonides Schanz on 07/12/21 at 10:10 am. Appointment date/time will appear on discharge paperwork.

## 2021-06-10 NOTE — Transfer of Care (Signed)
Immediate Anesthesia Transfer of Care Note  Patient: Kevin Callahan  Procedure(s) Performed: ESOPHAGOGASTRODUODENOSCOPY (EGD) WITH PROPOFOL  Patient Location: PACU  Anesthesia Type:MAC  Level of Consciousness: drowsy  Airway & Oxygen Therapy: Patient Spontanous Breathing and Patient connected to nasal cannula oxygen  Post-op Assessment: Report given to RN and Post -op Vital signs reviewed and stable  Post vital signs: Reviewed and stable  Last Vitals:  Vitals Value Taken Time  BP 123/81 06/10/21 0918  Temp    Pulse 71 06/10/21 0919  Resp 19 06/10/21 0919  SpO2 98 % 06/10/21 0919  Vitals shown include unvalidated device data.  Last Pain:  Vitals:   06/10/21 0815  TempSrc: Temporal  PainSc: 0-No pain      Patients Stated Pain Goal: 1 (93/79/02 4097)  Complications: No notable events documented.

## 2021-06-10 NOTE — Anesthesia Procedure Notes (Signed)
Procedure Name: General with mask airway Date/Time: 06/10/2021 9:05 AM Performed by: Katina Degree, CRNA Pre-anesthesia Checklist: Patient identified, Emergency Drugs available, Suction available and Patient being monitored Patient Re-evaluated:Patient Re-evaluated prior to induction Oxygen Delivery Method: Nasal cannula Preoxygenation: Pre-oxygenation with 100% oxygen Induction Type: IV induction Airway Equipment and Method: Bite block Dental Injury: Teeth and Oropharynx as per pre-operative assessment

## 2021-06-10 NOTE — Telephone Encounter (Signed)
-----   Message from Napoleon Form, MD sent at 06/10/2021 11:41 AM EDT ----- Ammie,  Can you please schedule follow up visit for abdominal pain next available in 4-6 weeks with Dr.Dorsey. May need to consider additional work up if develops recurrent abdominal pain.Thanks

## 2021-06-10 NOTE — Progress Notes (Signed)
EEG complete - results pending 

## 2021-06-10 NOTE — Discharge Summary (Addendum)
Family Medicine Teaching [facility] Discharge Summary  Patient name: Kevin Callahan Medical record number: [id number] Date of birth: [birth year] Age: 41 y.o. Gender: male Date of Admission: 06/08/2021  Date of Discharge: 06/10/2021 Admitting Physician: Westley Chandler, MD  Primary Care Provider: [facility], [facility]: GI  Indication for Hospitalization: Abdominal pain   Discharge Diagnoses/Problem List:  Lactic acidosis Hypertension HLD Vertigo  Disposition: Home  Discharge Condition: Stable  Discharge Exam:  General: Alert, well appearing, NAD HEENT: Atraumatic, MMM, No sclera icterus CV: RRR, no murmurs, normal S1/S2 Pulm: CTAB, good WOB on RA, no crackles or wheezing Abd: Soft, no distension, no tenderness Skin: dry, warm Ext: No BLE edema   Brief Hospital Course:  Kevin Callahan is a 41 y.o. male who was admitted to the [facility] Practice Teaching Service at [facility] for Abdominal pain. Hospital course is outlined below by system.   Abdominal pain, lactic acidosis Patient presented with acute onset epigastric pain.  In the ED: Lactic acid 6.9, CBC, CMP within normal limits.  Dissection study was negative for aortic dissection.  Initially given elevated lactic acid there was a suspicion for ischemic colitis and surgery were consulted.  Surgery did not recommend any acute intervention.  Upper quadrant ultrasound negative.  Patient's pain improved slightly with Tylenol and oxycodone.  GI were consulted on day 2 of admission who recommended EGD.  Patient had EGD on 5/22 which was unremarkable. Continued patient on Protonix and recommend outpatient follow up with GI  Intermittent dizziness Patient had dizziness and felt as though the room was spinning around him.  This also led to multiple episodes of nausea and vomiting. Head CT showed no acute intracranial process and his EEG was normal.  He was treated with Zofran and meclizine in the hospital.   The cause of his dizziness is unclear but suspicious of BPPV. Recommend follow up with outpatient neurology.  Elevated blood pressures BP 1 50-160 systolic.  Could be due to acute pain or underlying hypertension. He was asymptomatic. Patient could benefit from long term BP management outpatient.   PCP recommendations: Follow-up with GI as an outpatient Elevated blood pressures in the hospital, consider starting on antihypertensive Refer to neurology outpatient for on going vertigo symptoms    Significant Procedures:  EGD   Significant Labs and Imaging:  Recent Labs  Lab 06/08/21 0904 06/10/21 0036  WBC 9.6 10.5  HGB 15.5 13.8  HCT 44.2 40.4  PLT 360 278   Recent Labs  Lab 06/08/21 0904 06/09/21 0043 06/10/21 0036  NA 139 138 138  K 3.2* 3.7 3.5  CL 106 106 107  CO2 17* 23 23  GLUCOSE 205* 110* 100*  BUN 6 <5* <5*  CREATININE 1.09 0.80 0.85  CALCIUM 9.3 8.7* 8.9  ALKPHOS 66 53  --   AST 25 15  --   ALT 20 17  --   ALBUMIN 4.2 3.5  --     CT HEAD WO CONTRAST ( )  Result Date: 06/09/2021 CLINICAL DATA:  Dizziness EXAM: CT HEAD WITHOUT CONTRAST TECHNIQUE: Contiguous axial images were obtained from the base of the skull through the vertex without intravenous contrast. RADIATION DOSE REDUCTION: This exam was performed according to the departmental dose-optimization program which includes automated exposure control, adjustment of the mA and/or kV according to patient size and/or use of iterative reconstruction technique. COMPARISON:  None Available. FINDINGS: Brain: No evidence of acute infarction, hemorrhage, cerebral edema, mass, mass effect, or midline shift. No hydrocephalus or  extra-axial fluid collection. Vascular: No hyperdense vessel. Skull: Normal. Negative for fracture or focal lesion. Sinuses/Orbits: No acute finding. Other: The mastoid air cells are well aerated. IMPRESSION: No acute intracranial process. No etiology is seen for the patient's dizziness.  Electronically Signed   By: Wiliam Ke M.D.   On: 06/09/2021 19:50     Results/Tests Pending at Time of Discharge: None  Discharge Medications:  Allergies as of 06/10/2021   No Known Allergies      Medication List     STOP taking these medications    ibuprofen 200 MG tablet Commonly known as: ADVIL   traZODone 50 MG tablet Commonly known as: DESYREL       TAKE these medications    acetaminophen 500 MG tablet Commonly known as: TYLENOL Take 500 mg by mouth daily as needed for fever (pain).   GAS RELIEF PO Take 1 tablet by mouth daily as needed (gas/bloating).   GINGER ROOT PO Take 1 capsule by mouth once.   meclizine 12.5 MG tablet Commonly known as: ANTIVERT Take 1 tablet (12.5 mg total) by mouth 2 (two) times daily as needed for dizziness.   ondansetron 4 MG tablet Commonly known as: ZOFRAN Take 1 tablet (4 mg total) by mouth every 6 (six) hours as needed for nausea.   pantoprazole 40 MG tablet Commonly known as: PROTONIX Take 1 tablet (40 mg total) by mouth daily.   Vitamin D (Ergocalciferol) 1.25 MG (50000 UNIT) Caps capsule Commonly known as: DRISDOL Take 50,000 Units by mouth every Wednesday.        Discharge Instructions: Please refer to Patient Instructions section of EMR for full details.  Patient was counseled important signs and symptoms that should prompt return to medical care, changes in medications, dietary instructions, activity restrictions, and follow up appointments.   Follow-Up Appointments:   Jerre Simon, MD 06/10/2021, 3:18 PM PGY-1, [facility]

## 2021-06-10 NOTE — Progress Notes (Signed)
Patient discharged to home, AVS reviewed. TOC delivered new prescriptions. Patient will follow up with PCP, neuro, and GI. IV removed. Patient confirmed he has all of his belongings. He declined assistance to the exit.

## 2021-06-10 NOTE — Progress Notes (Deleted)
Pt refused milk and molasses enema, said she would rather get it during day instead of night time. Educated pt and notified on call MD.

## 2021-06-10 NOTE — Discharge Instructions (Signed)
Dear Joana Reamer,   Thank you for letting us participate in your care! In this section, you will find a brief hospital admission summary of why you were admitted to the hospital, what happened during your admission, your diagnosis/diagnoses, and recommended follow up.   You were admitted for abdominal pain that was treated with pain medication, antiemetic and pantoprazole.  Upper endoscopy was normal. Please follow up with your PCP.  You were also treated for vertigo with meclizine. Head imagining and EEG were normal   POST-HOSPITAL & CARE INSTRUCTIONS Follow-up with PCP for hypertension Please let PCP/Specialists know of any changes in medications that were made.  Please see medications section of this packet for any medication changes.   DOCTOR'S APPOINTMENTS & FOLLOW UP Future Appointments  Date Time Provider Department Center  07/12/2021 10:10 AM Imogene Burn, MD [facility]     Thank you for choosing [facility]! Take care and be well!  Family Medicine Teaching Service Inpatient Team [place]  [facility]  [address] [place], [place] [address] [phone]

## 2021-06-10 NOTE — Anesthesia Preprocedure Evaluation (Signed)
Anesthesia Evaluation  Patient identified by MRN, date of birth, ID band Patient awake    Reviewed: Allergy & Precautions, NPO status , Patient's Chart, lab work & pertinent test results  Airway Mallampati: II  TM Distance: >3 FB Neck ROM: Full    Dental no notable dental hx.    Pulmonary neg pulmonary ROS, former smoker,    Pulmonary exam normal breath sounds clear to auscultation       Cardiovascular hypertension, Pt. on medications Normal cardiovascular exam Rhythm:Regular Rate:Normal     Neuro/Psych negative neurological ROS  negative psych ROS   GI/Hepatic negative GI ROS, Neg liver ROS,   Endo/Other  negative endocrine ROS  Renal/GU negative Renal ROS  negative genitourinary   Musculoskeletal negative musculoskeletal ROS (+)   Abdominal   Peds negative pediatric ROS (+)  Hematology negative hematology ROS (+)   Anesthesia Other Findings   Reproductive/Obstetrics negative OB ROS                             Anesthesia Physical Anesthesia Plan  ASA: 2  Anesthesia Plan: MAC   Post-op Pain Management:    Induction: Intravenous  PONV Risk Score and Plan: 1 and Propofol infusion, TIVA and Treatment may vary due to age or medical condition  Airway Management Planned: Natural Airway, Simple Face Mask and Nasal Cannula  Additional Equipment: None  Intra-op Plan:   Post-operative Plan:   Informed Consent: I have reviewed the patients History and Physical, chart, labs and discussed the procedure including the risks, benefits and alternatives for the proposed anesthesia with the patient or authorized representative who has indicated his/her understanding and acceptance.     Dental advisory given  Plan Discussed with: CRNA, Anesthesiologist and Surgeon  Anesthesia Plan Comments:         Anesthesia Quick Evaluation

## 2021-06-10 NOTE — Op Note (Addendum)
[facility] Patient Name: Kevin Callahan Procedure Date : 06/10/2021 MRN: [id number] Attending MD: Napoleon Form , MD Date of Birth: [birth year] CSN: [id number] Age: 41 Admit Type: Inpatient Procedure:                Upper GI endoscopy Indications:              Generalized and epigastric abdominal pain Providers:                Napoleon Form, MD, Lorenza Evangelist, RN,                            Rozetta Nunnery, Technician Referring MD:              Medicines:                Monitored Anesthesia Care Complications:            No immediate complications. Estimated Blood Loss:     Estimated blood loss: none. Procedure:                Pre-Anesthesia Assessment:                           - Prior to the procedure, a History and Physical                            was performed, and patient medications and                            allergies were reviewed. The patient's tolerance of                            previous anesthesia was also reviewed. The risks                            and benefits of the procedure and the sedation                            options and risks were discussed with the patient.                            All questions were answered, and informed consent                            was obtained. Prior Anticoagulants: The patient has                            taken no previous anticoagulant or antiplatelet                            agents. ASA Grade Assessment: II - A patient with                            mild systemic disease. After reviewing the risks  and benefits, the patient was deemed in                            satisfactory condition to undergo the procedure.                           After obtaining informed consent, the endoscope was                            passed under direct vision. Throughout the                            procedure, the patient's blood pressure, pulse, and                             oxygen saturations were monitored continuously. The                            [id number]-[id number] ([id number] Olympus endoscope was introduced                            through the mouth, and advanced to the second part                            of duodenum. The upper GI endoscopy was                            accomplished without difficulty. The patient                            tolerated the procedure well. Scope In: Scope Out: Findings:      The esophagus was normal.      The stomach was normal.      The cardia and gastric fundus were normal on retroflexion.      The examined duodenum was normal. Impression:               - Normal esophagus.                           - Normal stomach.                           - Normal examined duodenum.                           - No specimens collected. Recommendation:           - Patient has a contact number available for                            emergencies. The signs and symptoms of potential                            delayed complications were discussed with the  patient. Return to normal activities tomorrow.                            Written discharge instructions were provided to the                            patient.                           - Resume previous diet.                           - Continue present medications.                           - Follow up in GI office Dr Leonides Schanz, next available                            appointment in 4-6 weeks                           - Consider HIDA scan to exclude gallbladder                            colic/dysfunction if continues to have abdominal                            pain                           - Ok to discharge home from GI standpoint. GI will                            sign off, available if have any questions Procedure Code(s):        --- Professional ---                           [phone], Esophagogastroduodenoscopy, flexible,                             transoral; diagnostic, including collection of                            specimen(s) by brushing or washing, when performed                            (separate procedure) Diagnosis Code(s):        --- Professional ---                           R10.84, Generalized abdominal pain CPT copyright 2019 [employer]. All rights reserved. The codes documented in this report are preliminary and upon coder review may  be revised to meet current compliance requirements. Napoleon Form, MD 06/10/2021 9:27:06 AM This report has been signed electronically. Number of Addenda: 0

## 2021-06-11 ENCOUNTER — Encounter (HOSPITAL_COMMUNITY): Payer: Self-pay | Admitting: Gastroenterology

## 2021-07-12 ENCOUNTER — Ambulatory Visit: Payer: 59 | Admitting: Internal Medicine

## 2023-08-27 IMAGING — US US SCROTUM W/ DOPPLER COMPLETE
1 series · 14 of 25 positions shown · non-contrast
Comparison: CT angiography chest, abdomen, and pelvis 06/08/2021

CLINICAL DATA: Concern for testicular torsion

Abdominal pain, nausea, emesis
EXAM:
SCROTAL ULTRASOUND
DOPPLER ULTRASOUND OF THE TESTICLES
TECHNIQUE: Complete ultrasound examination of the testicles, epididymis, and
other scrotal structures was performed. Color and spectral Doppler
ultrasound were also utilized to evaluate blood flow to the
testicles.

[Series 1: us scrotum w/doppler · 14 of 59 slices shown]
[im 1/59]
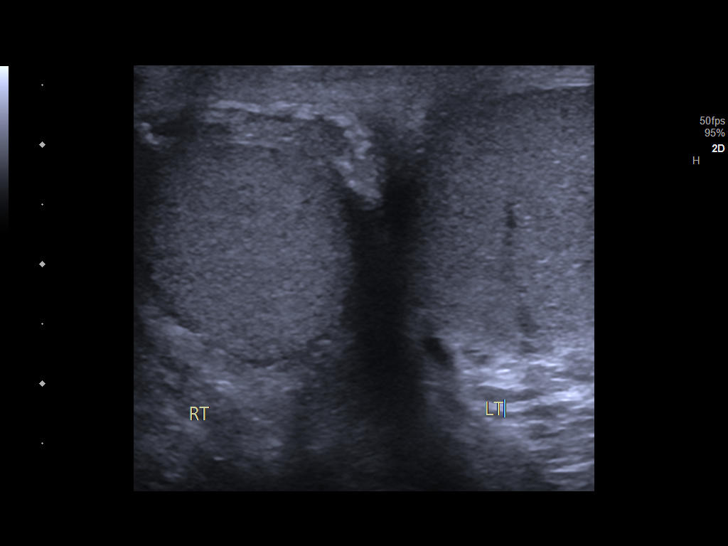
[im 5/59]
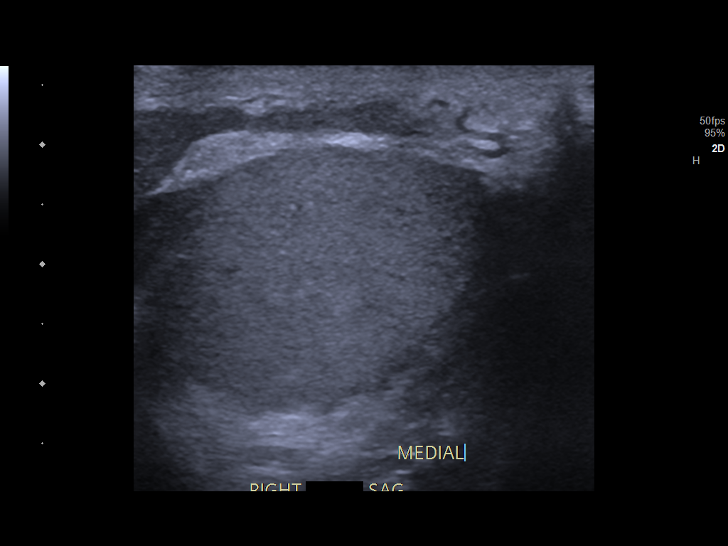
[im 10/59]
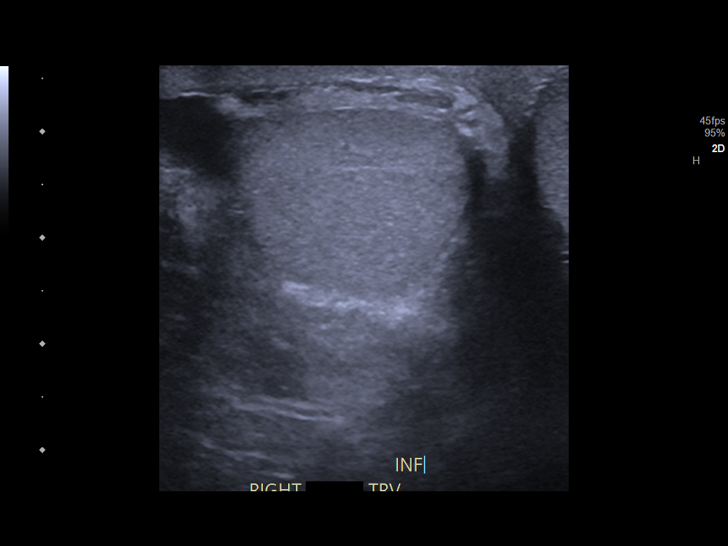
[im 15/59]
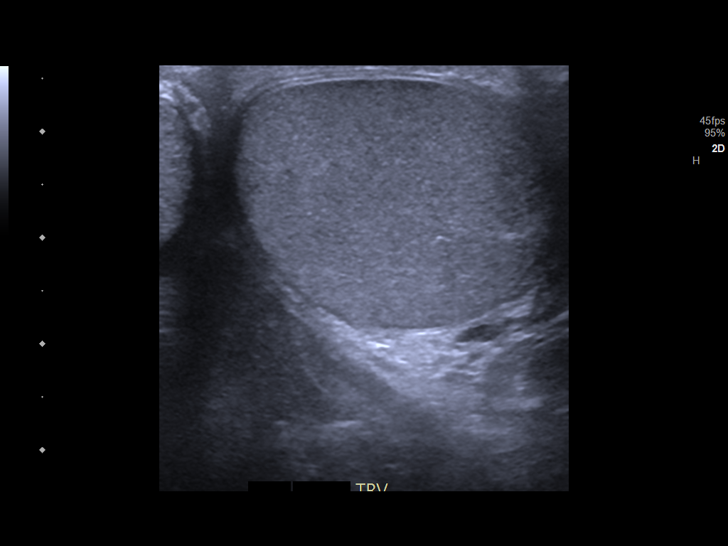
[im 20/59]
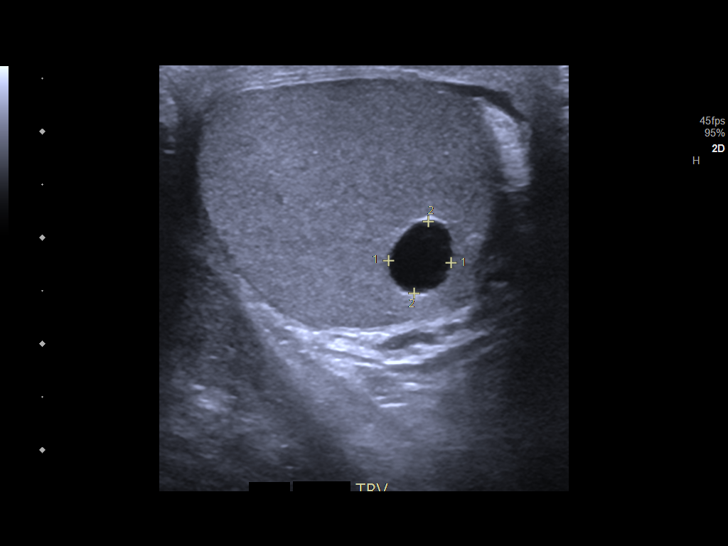
[im 22/59]
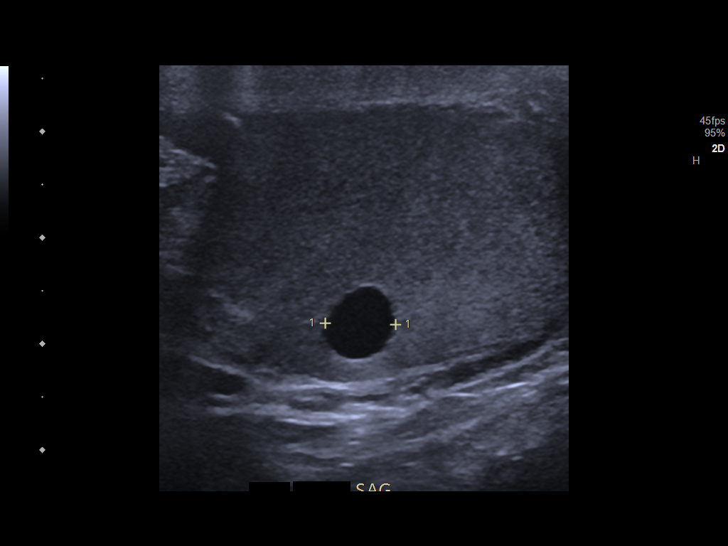
[im 27/59]
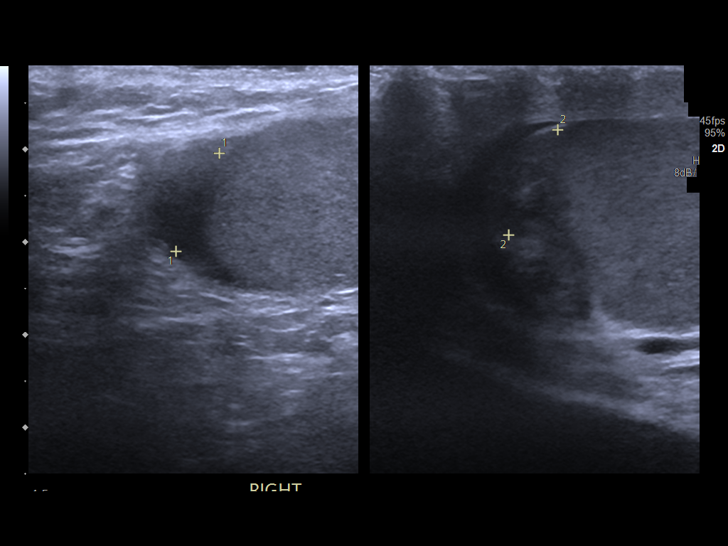
[im 32/59]
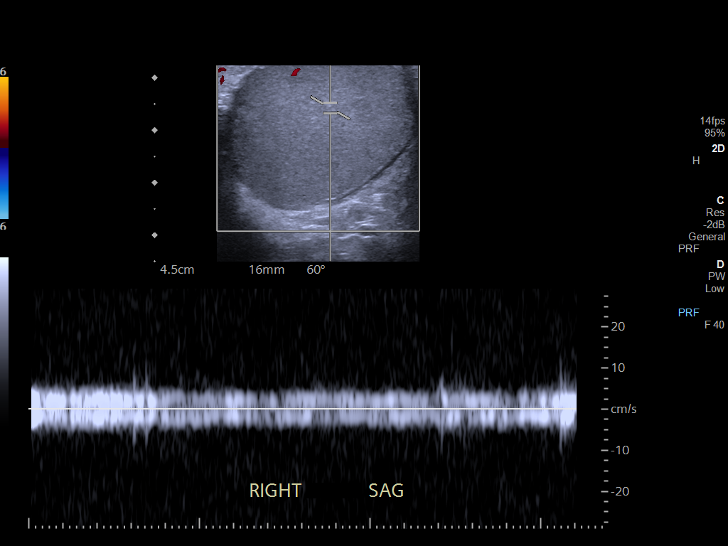
[im 37/59]
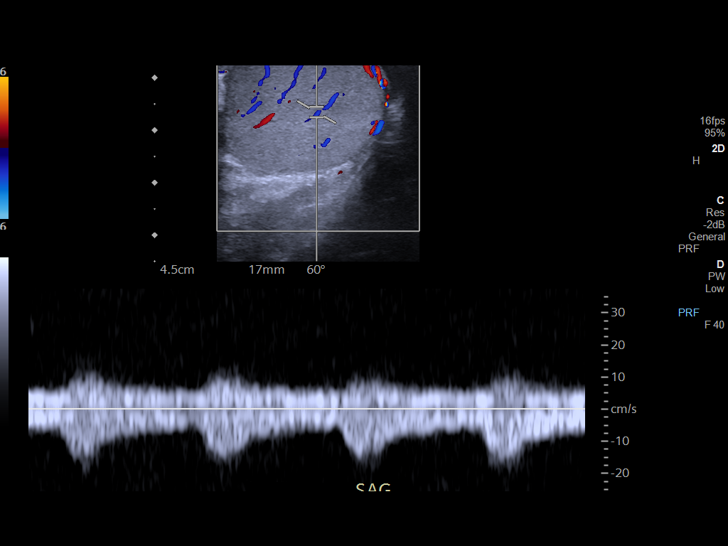
[im 39/59]
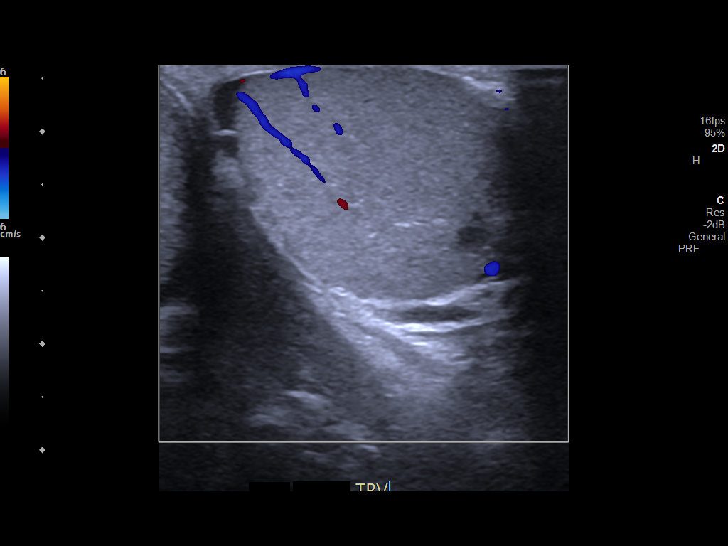
[im 44/59]
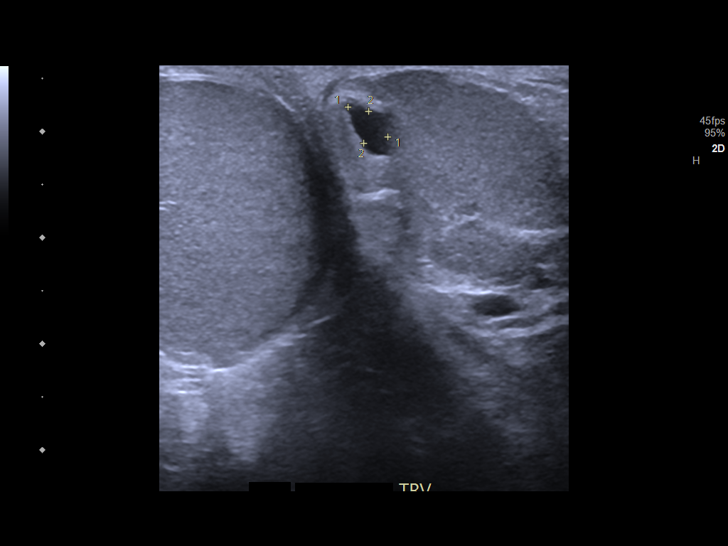
[im 49/59]
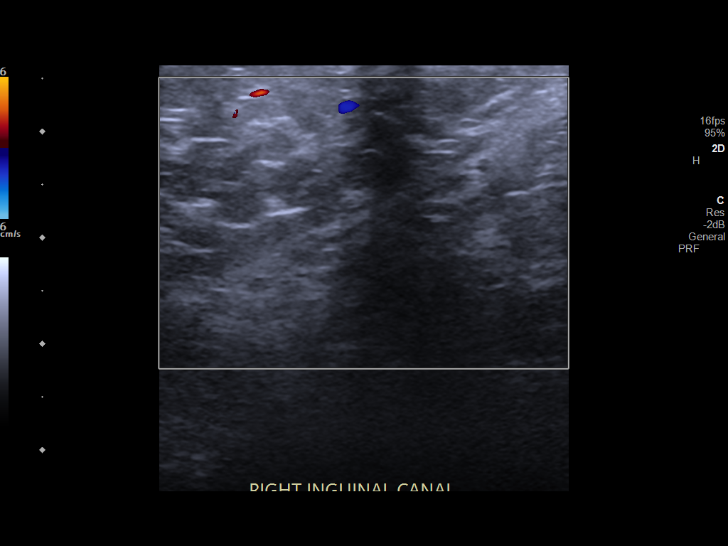
[im 54/59]
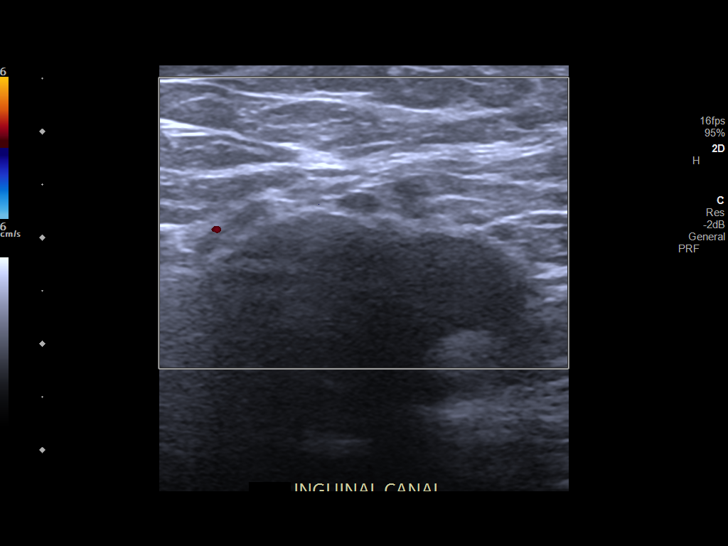
[im 59/59]
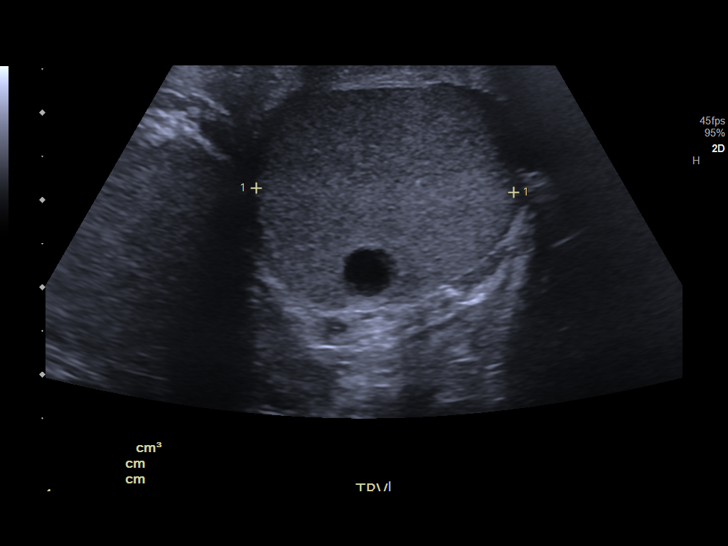

[14 of 25 positions shown; findings below may reference images not displayed]

FINDINGS: Right testicle

Measurements: 3.9 x 2.6 x 2.9 cm. No mass or microlithiasis
visualized.

Left testicle

Measurements: 3.7 x 2.4 x 3.0 cm. 7 mm simple cyst of doubtful
clinical significance.

Right epididymis:  Normal in size and appearance.

Left epididymis:  5 mm left epididymal cyst.

Hydrocele:  None visualized.

Varicocele:  None visualized.

Pulsed Doppler interrogation of both testes demonstrates normal low
resistance arterial and venous waveforms bilaterally.
IMPRESSION: No significant sonographic abnormality of the scrotum.

## 2023-08-28 IMAGING — CT CT HEAD W/O CM
4 series · 16 of 47 positions shown, 18 images · non-contrast
Comparison: None Available.

CLINICAL DATA: Dizziness



[Series 2: head wo · axial · 0.44mm/px · z∈[+1332,+1448]mm · 7 of 31 slices shown, 9 images]
[im 4/31  brain]
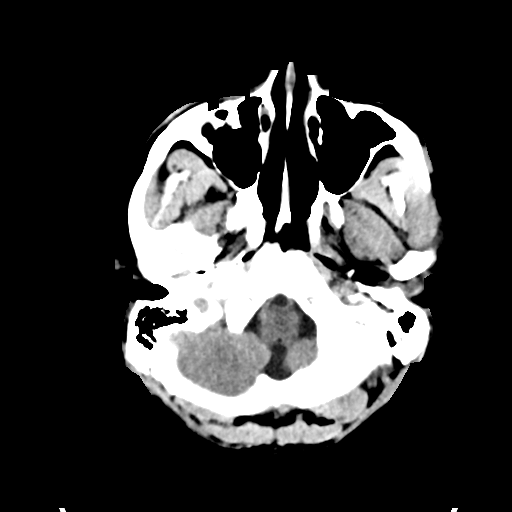
[im 4/31  bone]
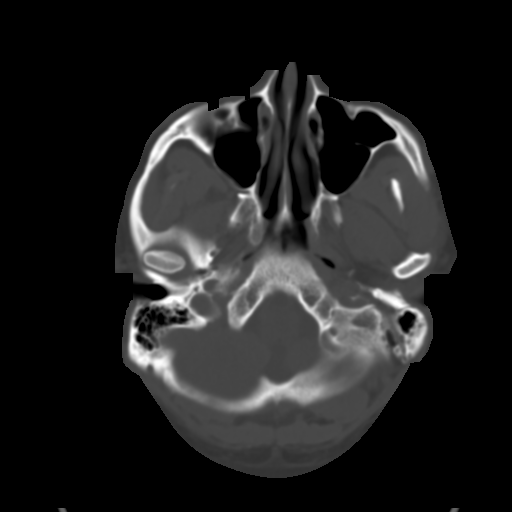
[im 8/31  brain]
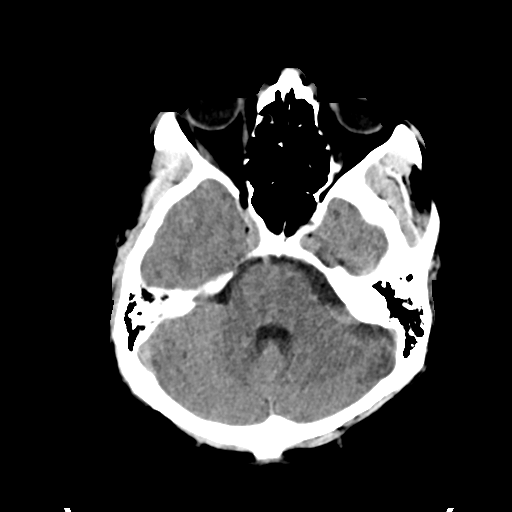
[im 12/31  brain]
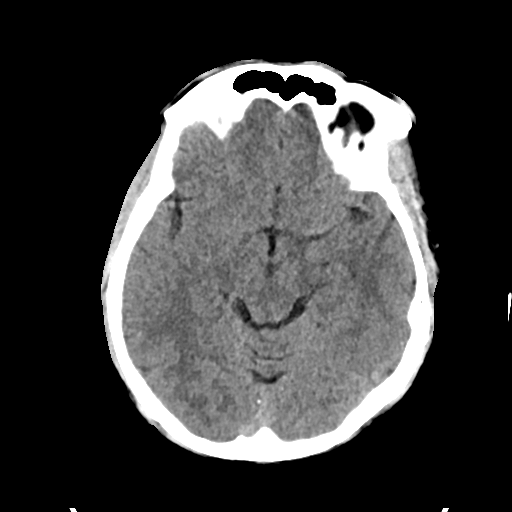
[im 16/31  brain]
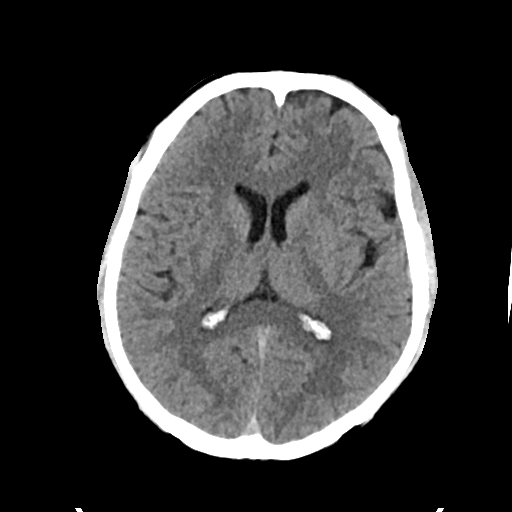
[im 19/31  brain]
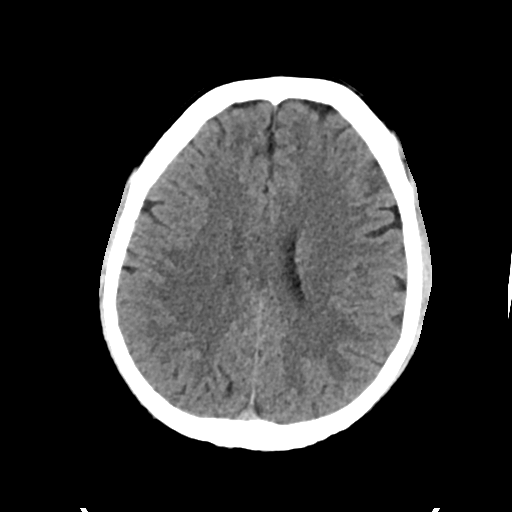
[im 19/31  bone]
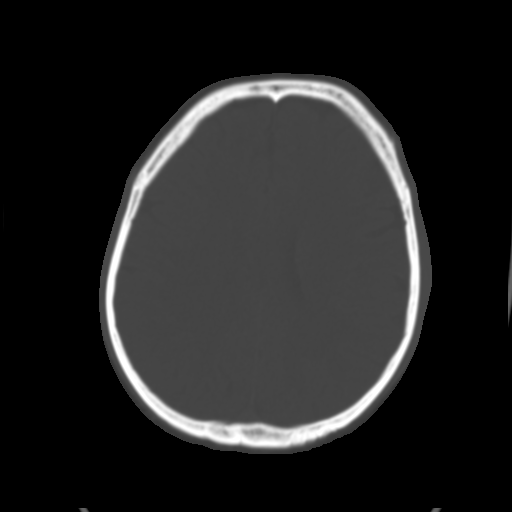
[im 23/31  brain]
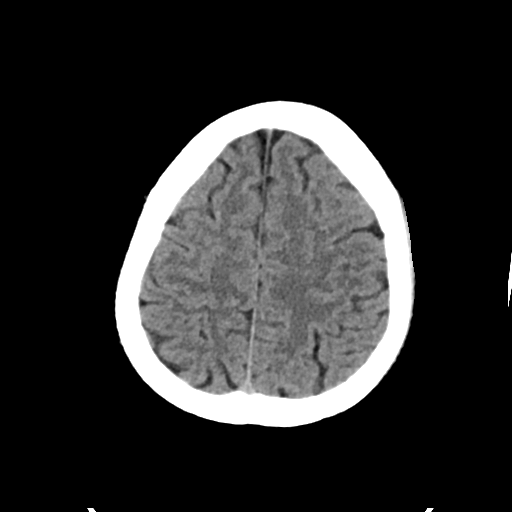
[im 27/31  brain]
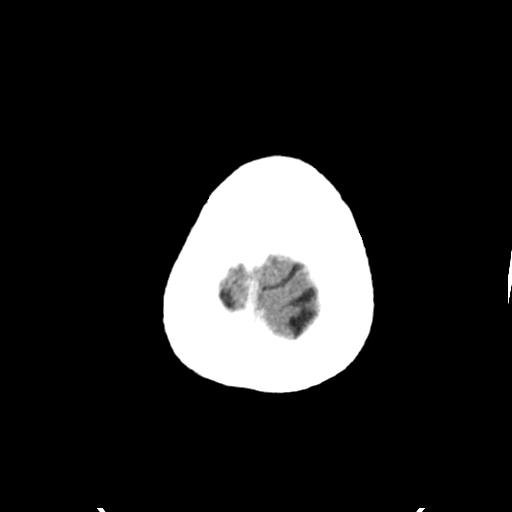

[Series 3: head bone · axial · 0.44mm/px · z∈[+1332,+1362]mm · 3 of 76 slices shown]
[im 8/76  bone]
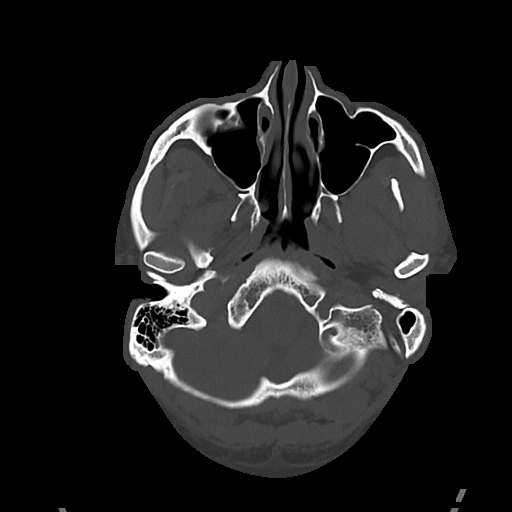
[im 16/76  bone]
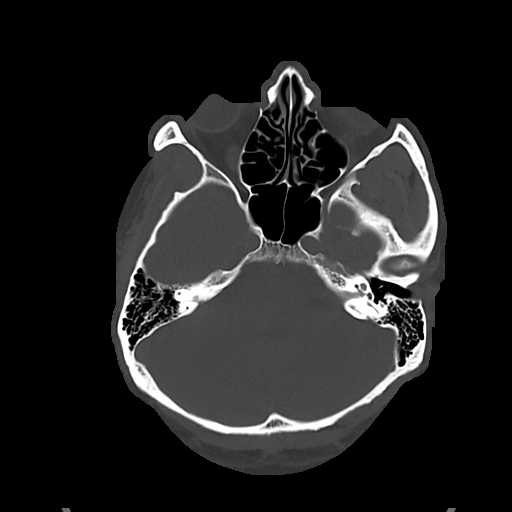
[im 23/76  bone]
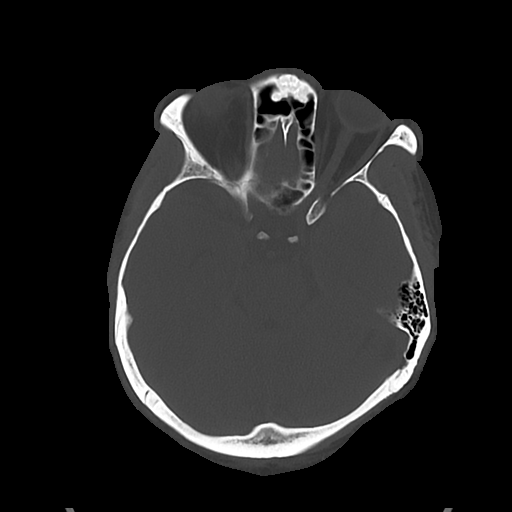

[Series 4: cor soft · coronal · 0.29mm/px · 3 of 71 slices shown]
[im 24/71  brain]
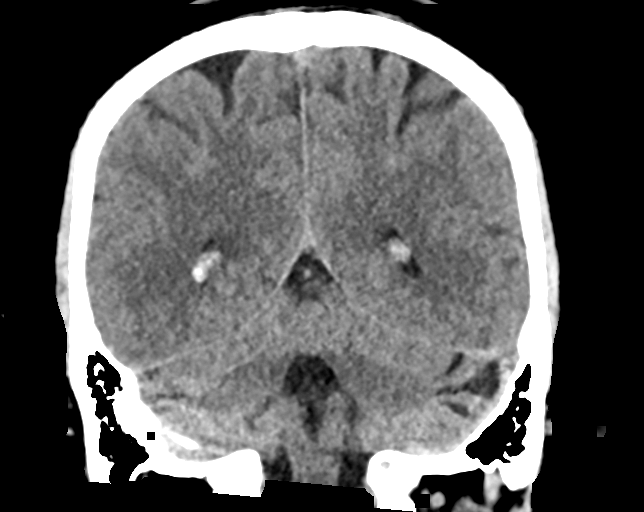
[im 32/71  brain]
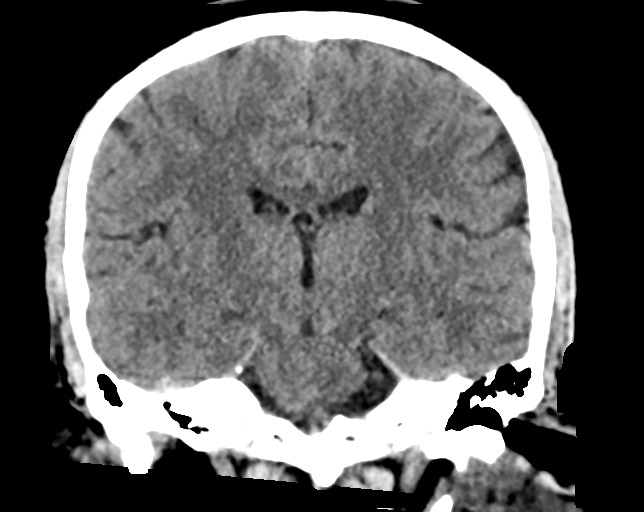
[im 39/71  brain]
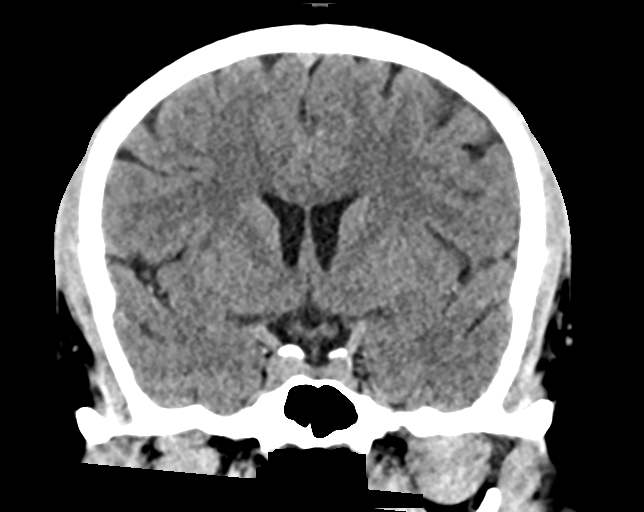

[Series 5: sag soft · sagittal · 0.29mm/px · 3 of 62 slices shown]
[im 21/62  brain]
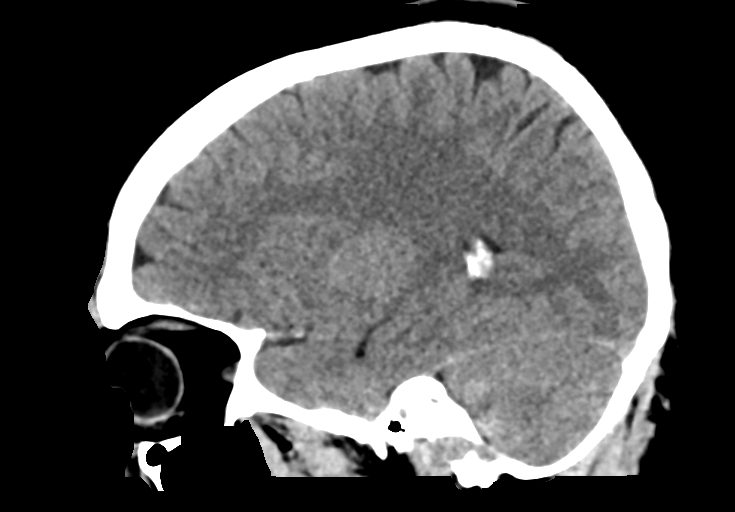
[im 31/62  brain]
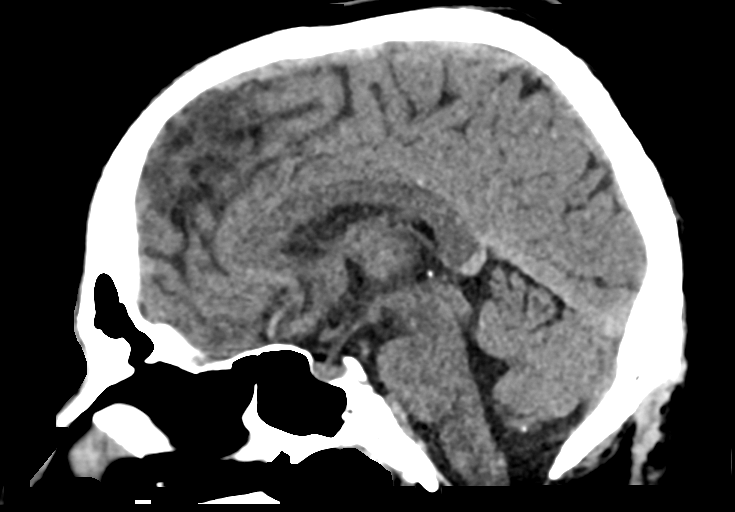
[im 41/62  brain]
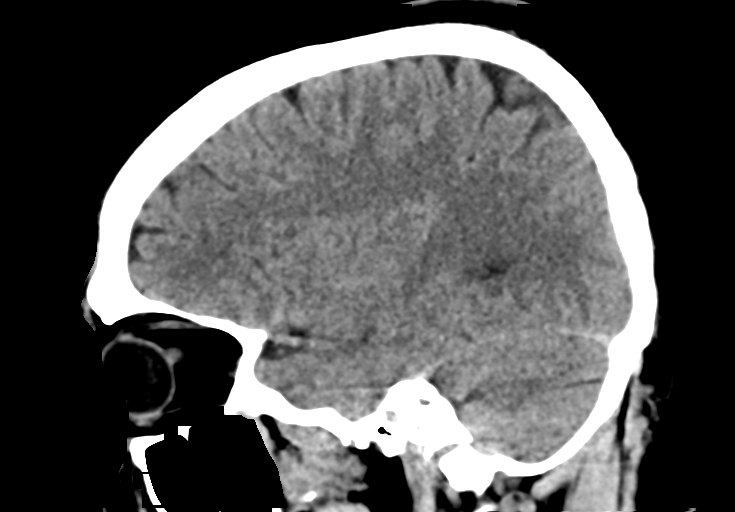

[16 of 47 positions shown; findings below may reference images not displayed]

FINDINGS: Brain: No evidence of acute infarction, hemorrhage, cerebral edema,
mass, mass effect, or midline shift. No hydrocephalus or extra-axial
fluid collection.

Vascular: No hyperdense vessel.

Skull: Normal. Negative for fracture or focal lesion.

Sinuses/Orbits: No acute finding.

Other: The mastoid air cells are well aerated.
IMPRESSION: No acute intracranial process. No etiology is seen for the patient's
dizziness.
